# Patient Record
Sex: Male | Born: 1950 | Race: White | Hispanic: No | Marital: Married | Smoking: Former smoker
Health system: Southern US, Community
[De-identification: ages and names within clinical notes are randomized; demographics above are authoritative.]

## PROBLEM LIST (undated history)

## (undated) DIAGNOSIS — K5790 Diverticulosis of intestine, part unspecified, without perforation or abscess without bleeding: Secondary | ICD-10-CM

## (undated) DIAGNOSIS — R413 Other amnesia: Secondary | ICD-10-CM

## (undated) DIAGNOSIS — F17200 Nicotine dependence, unspecified, uncomplicated: Secondary | ICD-10-CM

## (undated) DIAGNOSIS — J439 Emphysema, unspecified: Secondary | ICD-10-CM

## (undated) DIAGNOSIS — I10 Essential (primary) hypertension: Secondary | ICD-10-CM

## (undated) DIAGNOSIS — I700 Atherosclerosis of aorta: Secondary | ICD-10-CM

## (undated) DIAGNOSIS — E785 Hyperlipidemia, unspecified: Secondary | ICD-10-CM

## (undated) DIAGNOSIS — H9313 Tinnitus, bilateral: Secondary | ICD-10-CM

## (undated) DIAGNOSIS — E7800 Pure hypercholesterolemia, unspecified: Secondary | ICD-10-CM

## (undated) DIAGNOSIS — I2510 Atherosclerotic heart disease of native coronary artery without angina pectoris: Secondary | ICD-10-CM

## (undated) HISTORY — DX: Tinnitus, bilateral: H93.13

## (undated) HISTORY — DX: Emphysema, unspecified: J43.9

## (undated) HISTORY — DX: Nicotine dependence, unspecified, uncomplicated: F17.200

## (undated) HISTORY — PX: HERNIA REPAIR: SHX51

## (undated) HISTORY — PX: HAND SURGERY: SHX662

## (undated) HISTORY — DX: Atherosclerosis of aorta: I70.0

## (undated) HISTORY — DX: Pure hypercholesterolemia, unspecified: E78.00

## (undated) HISTORY — PX: HEMORRHOID SURGERY: SHX153

## (undated) HISTORY — PX: CARPAL TUNNEL RELEASE: SHX101

## (undated) HISTORY — PX: LEG SURGERY: SHX1003

## (undated) HISTORY — DX: Other amnesia: R41.3

## (undated) HISTORY — DX: Diverticulosis of intestine, part unspecified, without perforation or abscess without bleeding: K57.90

## (undated) HISTORY — DX: Atherosclerotic heart disease of native coronary artery without angina pectoris: I25.10

---

## 2003-05-21 ENCOUNTER — Ambulatory Visit (HOSPITAL_COMMUNITY): Admission: RE | Admit: 2003-05-21 | Discharge: 2003-05-21 | Payer: Self-pay | Admitting: Family Medicine

## 2004-02-04 ENCOUNTER — Ambulatory Visit (HOSPITAL_COMMUNITY): Admission: RE | Admit: 2004-02-04 | Discharge: 2004-02-04 | Payer: Self-pay | Admitting: Surgery

## 2004-02-04 ENCOUNTER — Ambulatory Visit (HOSPITAL_BASED_OUTPATIENT_CLINIC_OR_DEPARTMENT_OTHER): Admission: RE | Admit: 2004-02-04 | Discharge: 2004-02-04 | Payer: Self-pay | Admitting: Surgery

## 2005-06-11 ENCOUNTER — Ambulatory Visit (HOSPITAL_COMMUNITY): Admission: RE | Admit: 2005-06-11 | Discharge: 2005-06-11 | Payer: Self-pay | Admitting: Gastroenterology

## 2011-01-22 ENCOUNTER — Ambulatory Visit (INDEPENDENT_AMBULATORY_CARE_PROVIDER_SITE_OTHER): Payer: Commercial Managed Care - PPO | Admitting: General Surgery

## 2011-01-22 VITALS — BP 110/68 | HR 70 | Temp 98.2°F | Resp 16 | Ht 71.0 in | Wt 221.0 lb

## 2011-01-22 DIAGNOSIS — K644 Residual hemorrhoidal skin tags: Secondary | ICD-10-CM

## 2011-01-22 NOTE — Progress Notes (Signed)
Subjective:   Painful hemorrhoids  Patient ID: Evan [name], male   DOB: 01-27-1951, 60 y.o.   MRN: [id number] HPI Patient is a 60 year old male referred by Dr. Kevan Ny due to severe painful hemorrhoids. He states that for a number of years he has had some mild hemorrhoid symptoms with occasional bleeding and irritation that did not require specific treatment. He was doing lifting this past weekend now 3 days ago and developed the fairly sudden onset of severe perianal swelling and pain. He was seen today by Dr. Kevan Ny and referred. He did have a colonoscopy 5 years ago. He is using pain medication and creams which are helping with symptoms but he feels the hemorrhoids are about the same.  Review of Systems  Respiratory: Negative.   Cardiovascular: Negative.   Gastrointestinal: Negative.        Objective:   Physical Exam General: Moderately obese Caucasian male in no severe distress Skin: Warm and dry HEENT: No scleral icterus or mass Lungs: Clear without increased work of breathing Cardiovascular: Regular rate and rhythm without murmur Abdomen: Well-healed supraumbilical incision. Soft and nontender without distention. Rectal: There are severe thrombosed circumferential external hemorrhoids with some internal hemorrhoid prolapse and early strangulation as well. Very tender. 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 Gait normal    Assessment:     Severe thrombosed external hemorrhoids with internal hemorrhoid prolapse and early strangulation as well. We discussed options of continued medical management versus formal hemorrhoidectomy. There is not really any sort of minor office procedure I can offer that would provide any relief for this problem. I believe he would be best served with a formal 2 or 3 column hemorrhoidectomy on an urgent basis. After discussion he is agreeable to this. We discussed the nature of the  procedure and used a general anesthetic and risks of bleeding and infection. We'll schedule this for him tomorrow.    Plan:     Urgent formal hemorrhoidectomy under general anesthesia. We'll plan overnight hospitalization.

## 2011-01-23 ENCOUNTER — Other Ambulatory Visit (INDEPENDENT_AMBULATORY_CARE_PROVIDER_SITE_OTHER): Payer: Self-pay | Admitting: General Surgery

## 2011-01-23 ENCOUNTER — Ambulatory Visit (HOSPITAL_COMMUNITY): Payer: 59

## 2011-01-23 ENCOUNTER — Ambulatory Visit (HOSPITAL_COMMUNITY)
Admission: RE | Admit: 2011-01-23 | Discharge: 2011-01-24 | Disposition: A | Discharge status: Home / Self Care | Payer: 59 | Source: Ambulatory Visit | Attending: General Surgery | Admitting: General Surgery

## 2011-01-23 DIAGNOSIS — F17200 Nicotine dependence, unspecified, uncomplicated: Secondary | ICD-10-CM | POA: Insufficient documentation

## 2011-01-23 DIAGNOSIS — I10 Essential (primary) hypertension: Secondary | ICD-10-CM | POA: Insufficient documentation

## 2011-01-23 DIAGNOSIS — K645 Perianal venous thrombosis: Secondary | ICD-10-CM | POA: Insufficient documentation

## 2011-01-23 DIAGNOSIS — K648 Other hemorrhoids: Secondary | ICD-10-CM

## 2011-01-23 DIAGNOSIS — K644 Residual hemorrhoidal skin tags: Secondary | ICD-10-CM

## 2011-01-23 HISTORY — PX: HEMORRHOID SURGERY: SHX153

## 2011-01-23 LAB — BASIC METABOLIC PANEL
BUN: 13 mg/dL (ref 6–23)
CO2: 29 mEq/L (ref 19–32)
Calcium: 9.4 mg/dL (ref 8.4–10.5)
Chloride: 103 mEq/L (ref 96–112)
Creatinine, Ser: 0.76 mg/dL (ref 0.50–1.35)
GFR calc Af Amer: >90 mL/min (ref >90)
GFR calc non Af Amer: >90 mL/min (ref >90)
Glucose, Bld: 91 mg/dL (ref 70–99)
Potassium: 4.2 mEq/L (ref 3.5–5.1)
Sodium: 139 mEq/L (ref 135–145)

## 2011-01-23 LAB — CBC
HCT: 45.4 % (ref 39.0–52.0)
Hemoglobin: 15.6 g/dL (ref 13.0–17.0)
MCH: 30.6 pg (ref 26.0–34.0)
MCHC: 34.4 g/dL (ref 30.0–36.0)
MCV: 89 fL (ref 78.0–100.0)
Platelets: 245 10*3/uL (ref 150–400)
RBC: 5.1 MIL/uL (ref 4.22–5.81)
RDW: 12.9 % (ref 11.5–15.5)
WBC: 8.6 10*3/uL (ref 4.0–10.5)

## 2011-01-23 LAB — SURGICAL PCR SCREEN
MRSA, PCR: NEGATIVE
Staphylococcus aureus: NEGATIVE

## 2011-01-26 ENCOUNTER — Telehealth (INDEPENDENT_AMBULATORY_CARE_PROVIDER_SITE_OTHER): Payer: Self-pay | Admitting: General Surgery

## 2011-01-26 NOTE — Telephone Encounter (Signed)
The patient contacted the office requesting a refill on Lidocaine gel 5 %, ok to refill per Dr. Ezzard Standing, notified patient. Sent to [facility] on [address]

## 2011-01-29 NOTE — Op Note (Signed)
  NAMETYWAUN, HILTNER                  ACCOUNT NO.:  [id number]  MEDICAL RECORD NO.:  [id number]  LOCATION:  1532                         FACILITY:  [facility]  PHYSICIAN:  Sharlet Salina T. Kailand Seda, M.D.DATE OF BIRTH:  [birth year]  DATE OF PROCEDURE:  01/23/2011 DATE OF DISCHARGE:                              OPERATIVE REPORT   PREOPERATIVE DIAGNOSIS:  Severe thrombosed prolapsed combination hemorrhoids.  POSTOPERATIVE DIAGNOSIS:  Severe thrombosed prolapsed combination hemorrhoids.  SURGICAL PROCEDURE:  Extensive 3-column hemorrhoidectomy.  SURGEON:  Lorne Skeens. Dequante Tremaine, M.D.  ANESTHESIA:  General.  BRIEF HISTORY:  Mr. Mcgregor is a 60 year old male with history of mild hemorrhoid symptoms for number of years.  While lifting 2 days ago, he developed sudden onset of rectal pain and swelling and presents with circumferential severe thrombosed, prolapsed combination hemorrhoids. We discussed options for continued conservative versus surgical management and have elected to proceed with hemorrhoidectomy.  We discussed the nature of the procedure, its indications, risks of anesthetic complications, bleeding, infection, and rare risk of a stenosis.  He is now brought to operating room for this.  DESCRIPTION OF PROCEDURE:  The patient was to the operating room, placed in supine position on operative table. General endotracheal anesthesia was induced. He was carefully positioned in lithotomy position. The perineum widely sterilely prepped and draped.  He received preoperative IV antibiotics.  PAS were in place.  Correct patient and procedure were verified.  Examination of the anus again confirmed large groups of thrombosed, mostly external but also an internal prolapsed hemorrhoids. These did fall into a concentrated groups in the right anterior, right posterior, and left lateral areas.  A Sims retractor was placed and initially the right anterior group was exposed.  A 2-0 chromic suture was  placed around the base of the hemorrhoid group internally.  An elliptical incision extending out on anoderm and encompassing the large group of hemorrhoids was performed, initially with superficial sharp incision and then dissection using the Harmonic scalpel.  The hemorrhoid group was dissected up off the anal sphincter which was identified and carefully protected.  After excision, the incision was closed with a running chromic in a locking fashion out on to the external skin. Following this, identical excisions were performed in the right posterior and left lateral position.  There was plenty of anoderm and no stenosis apparent. At the end of the procedure, the soft tissue was infiltrated with 20 cc of Exparel long-acting local anesthetic.  There was no bleeding.  Dry gauze dressing was applied. The patient was taken to the recovery in good condition.     Lorne Skeens. Orlen Leedy, M.D.     Tory Emerald  D:  01/23/2011  T:  01/24/2011  Job:  [id number]  Electronically Signed by Glenna Fellows M.D. on 01/29/2011 02:45:53 PM

## 2011-02-08 ENCOUNTER — Encounter (INDEPENDENT_AMBULATORY_CARE_PROVIDER_SITE_OTHER): Payer: Self-pay | Admitting: General Surgery

## 2011-02-08 ENCOUNTER — Ambulatory Visit (INDEPENDENT_AMBULATORY_CARE_PROVIDER_SITE_OTHER): Payer: Commercial Managed Care - PPO | Admitting: General Surgery

## 2011-02-08 VITALS — BP 118/82 | HR 80 | Temp 97.0°F | Resp 20 | Ht 71.0 in | Wt 217.1 lb

## 2011-02-08 DIAGNOSIS — Z09 Encounter for follow-up examination after completed treatment for conditions other than malignant neoplasm: Secondary | ICD-10-CM

## 2011-02-08 NOTE — Progress Notes (Signed)
Patient returns following extensive open hemorrhoidectomy for severe acute thrombosed hemorrhoids.He reports he is steadily feeling better.He still has some discomfort in the morning and feels essentially well in the afternoon.  On examination the incisions are healing nicely and there is no evidence of infection and very minimal swelling  Assessment and plan: Doing well following hemorrhoidectomy. Return in one month for a final check.

## 2011-03-12 ENCOUNTER — Telehealth (INDEPENDENT_AMBULATORY_CARE_PROVIDER_SITE_OTHER): Payer: Self-pay

## 2011-03-12 NOTE — Telephone Encounter (Signed)
Called and left patient voice message to call our office, pt. appt has been rescheduled to 03/29/11.

## 2011-03-16 ENCOUNTER — Encounter (INDEPENDENT_AMBULATORY_CARE_PROVIDER_SITE_OTHER): Payer: Commercial Managed Care - PPO | Admitting: General Surgery

## 2011-03-29 ENCOUNTER — Ambulatory Visit (INDEPENDENT_AMBULATORY_CARE_PROVIDER_SITE_OTHER): Payer: Commercial Managed Care - PPO | Admitting: General Surgery

## 2011-03-29 ENCOUNTER — Encounter (INDEPENDENT_AMBULATORY_CARE_PROVIDER_SITE_OTHER): Payer: Self-pay | Admitting: General Surgery

## 2011-03-29 VITALS — BP 128/82 | HR 74 | Temp 97.8°F | Resp 16 | Ht 71.0 in | Wt 221.4 lb

## 2011-03-29 DIAGNOSIS — Z09 Encounter for follow-up examination after completed treatment for conditions other than malignant neoplasm: Secondary | ICD-10-CM

## 2011-03-29 NOTE — Progress Notes (Signed)
Patient returns for more long-term followup approximately 6 weeks following emergency extensive hemorrhoidectomy for severe thrombosed gangrenous external hemorrhoids. He reports he is doing well. He has no pain or bleeding. Bowel movements are fine. He still feels a little bit tender which is improving.  On examination external exam shows no evidence of residual hemorrhoids. Digital exam shows no tenderness stenosis or other problems.  Assessment plan: Doing well with no palpitation identified. He is discharged return as needed.

## 2014-05-03 ENCOUNTER — Ambulatory Visit
Admission: RE | Admit: 2014-05-03 | Discharge: 2014-05-03 | Disposition: A | Discharge status: Home / Self Care | Payer: 59 | Source: Ambulatory Visit | Attending: Family Medicine | Admitting: Family Medicine

## 2014-05-03 ENCOUNTER — Other Ambulatory Visit: Payer: Self-pay | Admitting: Family Medicine

## 2014-05-03 DIAGNOSIS — R05 Cough: Secondary | ICD-10-CM

## 2014-05-03 DIAGNOSIS — R059 Cough, unspecified: Secondary | ICD-10-CM

## 2015-04-14 MED FILL — IBUPROFEN 600 MG TABLET: 600 | 5 days supply | Qty: 16 | Fill #0

## 2015-04-14 MED FILL — AMOXICILLIN 500 MG CAPSULE: 500 | 6 days supply | Qty: 25 | Fill #0

## 2015-04-14 MED FILL — HYDROCODON-APAP 5-325: 5-325 | 3 days supply | Qty: 16 | Fill #0

## 2015-06-30 MED FILL — LOSARTAN POTASSIUM 50 MG TA: 50 | 90 days supply | Qty: 90 | Fill #1

## 2015-06-30 MED FILL — SIMVASTATIN 40 MG TABLET: 40 | 90 days supply | Qty: 90 | Fill #1

## 2015-09-27 MED FILL — SIMVASTATIN 40 MG TABLET: 40 | 90 days supply | Qty: 90 | Fill #2

## 2015-09-27 MED FILL — LOSARTAN POTASSIUM 50 MG TA: 50 | 90 days supply | Qty: 90 | Fill #2

## 2015-12-29 MED FILL — LOSARTAN POTASSIUM 50 MG TA: 50 | 90 days supply | Qty: 90 | Fill #3

## 2015-12-29 MED FILL — SIMVASTATIN 40 MG TABLET: 40 | 90 days supply | Qty: 90 | Fill #3

## 2016-01-06 DIAGNOSIS — Z23 Encounter for immunization: Secondary | ICD-10-CM | POA: Diagnosis not present

## 2016-03-24 DIAGNOSIS — H52223 Regular astigmatism, bilateral: Secondary | ICD-10-CM | POA: Diagnosis not present

## 2016-03-24 DIAGNOSIS — H5203 Hypermetropia, bilateral: Secondary | ICD-10-CM | POA: Diagnosis not present

## 2016-03-24 DIAGNOSIS — H524 Presbyopia: Secondary | ICD-10-CM | POA: Diagnosis not present

## 2016-03-29 MED FILL — SIMVASTATIN 40 MG TABLET: 40 | 90 days supply | Qty: 90 | Fill #0

## 2016-03-29 MED FILL — LOSARTAN POTASSIUM 50 MG TA: 50 | 90 days supply | Qty: 90 | Fill #0

## 2016-04-06 DIAGNOSIS — Z125 Encounter for screening for malignant neoplasm of prostate: Secondary | ICD-10-CM | POA: Diagnosis not present

## 2016-04-06 DIAGNOSIS — I10 Essential (primary) hypertension: Secondary | ICD-10-CM | POA: Diagnosis not present

## 2016-04-06 DIAGNOSIS — R7303 Prediabetes: Secondary | ICD-10-CM | POA: Diagnosis not present

## 2016-04-06 DIAGNOSIS — E7800 Pure hypercholesterolemia, unspecified: Secondary | ICD-10-CM | POA: Diagnosis not present

## 2016-04-06 DIAGNOSIS — Z0000 Encounter for general adult medical examination without abnormal findings: Secondary | ICD-10-CM | POA: Diagnosis not present

## 2016-05-12 ENCOUNTER — Emergency Department (HOSPITAL_COMMUNITY): Payer: 59

## 2016-05-12 ENCOUNTER — Encounter (HOSPITAL_COMMUNITY): Payer: Self-pay | Admitting: Emergency Medicine

## 2016-05-12 ENCOUNTER — Emergency Department (HOSPITAL_COMMUNITY)
Admission: EM | Admit: 2016-05-12 | Discharge: 2016-05-12 | Disposition: A | Discharge status: Home / Self Care | Payer: 59 | Attending: Emergency Medicine | Admitting: Emergency Medicine

## 2016-05-12 DIAGNOSIS — Z79899 Other long term (current) drug therapy: Secondary | ICD-10-CM | POA: Insufficient documentation

## 2016-05-12 DIAGNOSIS — R079 Chest pain, unspecified: Secondary | ICD-10-CM

## 2016-05-12 DIAGNOSIS — Z87891 Personal history of nicotine dependence: Secondary | ICD-10-CM | POA: Insufficient documentation

## 2016-05-12 DIAGNOSIS — I10 Essential (primary) hypertension: Secondary | ICD-10-CM | POA: Diagnosis not present

## 2016-05-12 DIAGNOSIS — Z7982 Long term (current) use of aspirin: Secondary | ICD-10-CM | POA: Diagnosis not present

## 2016-05-12 DIAGNOSIS — R072 Precordial pain: Secondary | ICD-10-CM | POA: Insufficient documentation

## 2016-05-12 HISTORY — DX: Essential (primary) hypertension: I10

## 2016-05-12 HISTORY — DX: Hyperlipidemia, unspecified: E78.5

## 2016-05-12 LAB — BASIC METABOLIC PANEL
Anion gap: 8 (ref 5–15)
BUN: 15 mg/dL (ref 6–20)
CO2: 27 mmol/L (ref 22–32)
Calcium: 9.3 mg/dL (ref 8.9–10.3)
Chloride: 104 mmol/L (ref 101–111)
Creatinine, Ser: 0.93 mg/dL (ref 0.61–1.24)
GFR calc Af Amer: >60 mL/min (ref >60)
GFR calc non Af Amer: >60 mL/min (ref >60)
Glucose, Bld: 122 mg/dL — ABNORMAL HIGH (ref 65–99)
Potassium: 3.9 mmol/L (ref 3.5–5.1)
Sodium: 139 mmol/L (ref 135–145)

## 2016-05-12 LAB — I-STAT TROPONIN, ED
Troponin i, poc: 0 ng/mL (ref 0.00–0.08)
Troponin i, poc: 0 ng/mL (ref 0.00–0.08)

## 2016-05-12 LAB — CBC
HCT: 46 % (ref 39.0–52.0)
Hemoglobin: 15.8 g/dL (ref 13.0–17.0)
MCH: 29.2 pg (ref 26.0–34.0)
MCHC: 34.3 g/dL (ref 30.0–36.0)
MCV: 85 fL (ref 78.0–100.0)
Platelets: 291 10*3/uL (ref 150–400)
RBC: 5.41 MIL/uL (ref 4.22–5.81)
RDW: 13.1 % (ref 11.5–15.5)
WBC: 6.5 10*3/uL (ref 4.0–10.5)

## 2016-05-12 NOTE — ED Triage Notes (Signed)
Pt complaint of central chest pressure and reflux awoke from sleep at 0600 this morning; denies hx of same. Denies associated symptoms.

## 2016-05-12 NOTE — Discharge Instructions (Signed)
The work up in the ED was normal.  Please follow up with cardiologist next week for further evaluation and outpatient work up.  Return to the ER if you develop chest pain with nausea, sweating, shortness of breath or radiation to right arm or back.

## 2016-05-12 NOTE — ED Provider Notes (Signed)
[facility] DEPT Provider Note   CSN: [id number] Arrival date & time: 05/12/16  [id number]     History   Chief Complaint Chief Complaint  Patient presents with  . Chest Pain    HPI Evan [name] is a 66 y.o. male with pertinent past medical history of hypertension, hyperlipidemia obesity, remote history of tobacco abuse (quit over a year ago) presents to the emergency department reporting 2 episodes of reflux like, sternal intermittent chest discomfort that occurred in the middle of the night last night. Patient currently denies symptoms. Patient states he woke up to use the restroom in the middle of the night and noticed reflux like substernal/upper stomach discomfort, patient took 2 Tums and return to sleep. The second episode of chest discomfort occurred when he finally got out of bed, patient was still experiencing reflux-like chest discomfort so he drank a cup of milk which also did not help. Patient does have a history of reflux. Patient became concerned as he was unable to control his discomfort with Tums. Patient denies diaphoresis, nausea, vomiting, shortness of breath or radiation of chest discomfort to jaw or upper extremities. Patient states that he had one episode of chest pain 10 years ago where he was admitted for one day and a hospital in [place]. Patient states that they did some tests and told him that he was okay to go home. Patient has never been followed by a cardiologist.  HPI  Past Medical History:  Diagnosis Date  . Hyperlipemia   . Hypertension     There are no active problems to display for this patient.   Past Surgical History:  Procedure Laterality Date  . HAND SURGERY     multiple reconstructive surgeries   . HEMORRHOID SURGERY  01/23/11  . HEMORRHOID SURGERY    . HERNIA REPAIR    . LEG SURGERY         Home Medications    Prior to Admission medications   Medication Sig Start Date End Date Taking? Authorizing Provider  acetaminophen (TYLENOL) 500  MG tablet Take 1,000 mg by mouth every 6 (six) hours as needed.   Yes Historical Provider, MD  aspirin EC 81 MG tablet Take 81 mg by mouth daily.   Yes Historical Provider, MD  losartan (COZAAR) 50 MG tablet Take 50 mg by mouth at bedtime. 03/29/16  Yes Historical Provider, MD  simvastatin (ZOCOR) 40 MG tablet Take 40 mg by mouth daily.     Yes Historical Provider, MD    Family History No family history on file.  Social History Social History  Substance Use Topics  . Smoking status: Former Smoker    Packs/day: 0.00    Quit date: 08/2015  . Smokeless tobacco: Never Used  . Alcohol use Yes     Allergies   Patient has no active allergies.   Review of Systems Review of Systems  Constitutional: Negative for chills and fever.  HENT: Negative for congestion and sore throat.   Eyes: Negative for visual disturbance.  Respiratory: Negative for cough and shortness of breath.   Cardiovascular: Positive for chest pain. Negative for palpitations.  Gastrointestinal: Negative for abdominal pain, blood in stool, constipation, diarrhea, nausea and vomiting.  Genitourinary: Negative for difficulty urinating, flank pain and hematuria.  Musculoskeletal: Negative for joint swelling and myalgias.  Skin: Negative for rash.  Neurological: Negative for dizziness, syncope, weakness, light-headedness and headaches.  Hematological: Negative.   Psychiatric/Behavioral: Negative.      Physical Exam Updated Vital Signs BP  133/86 (BP Location: Right Arm)   Pulse 66   Temp 97.9 F (36.6 C) (Oral)   Resp 16   Ht 5\' 11"  (1.803 m)   Wt 97.5 kg   SpO2 97%   BMI 29.99 kg/m   Physical Exam  Constitutional: He is oriented to person, place, and time. He appears well-developed and well-nourished. No distress.  NAD.  HENT:  Head: Normocephalic and atraumatic.  Nose: Nose normal.  Mouth/Throat: Oropharynx is clear and moist. No oropharyngeal exudate.  Moist mucous membranes.  Oropharynx and tonsils  pink without erythema, edema, exudates or lesions.  Uvula midline. No trismus.   Eyes: Conjunctivae and EOM are normal. Pupils are equal, round, and reactive to light.  Neck: Normal range of motion. Neck supple. No JVD present. No tracheal deviation present.  Cardiovascular:  SBP 130. HR 73.  No JVD with head of bed at 30 degrees. Carotid pulses 2+ bilaterally without bruits. No lateral displacement of apical pulse.  Apical pulse without thrills or lifts.  Good S1 and S2. No extra sounds or murmurs. 2+ and symmetric radial, dorsalis pedis and posterior tibial pulses bilaterally.   Capillary refill brisk in upper extremities.  No varicosities seen. No lower extremity edema.  Patient denied shortness of breath, throat congestion or difficulty breathing with head of the bed flat. No orthopnea.  Good lung sounds without crackles.  Pulmonary/Chest: Effort normal and breath sounds normal. No respiratory distress. He has no wheezes. He has no rales.  Abdominal: Soft. Bowel sounds are normal. He exhibits no distension. There is no tenderness.  Musculoskeletal: Normal range of motion. He exhibits no deformity.  Lymphadenopathy:    He has no cervical adenopathy.  Neurological: He is alert and oriented to person, place, and time.  Skin: Skin is warm and dry. Capillary refill takes less than 2 seconds.  Psychiatric: He has a normal mood and affect. His behavior is normal. Judgment and thought content normal.  Nursing note and vitals reviewed.    ED Treatments / Results  Labs (all labs ordered are listed, but only abnormal results are displayed) Labs Reviewed  BASIC METABOLIC PANEL - Abnormal; Notable for the following:       Result Value   Glucose, Bld 122 (*)    All other components within normal limits  CBC  I-STAT TROPOININ, ED  I-STAT TROPOININ, ED    EKG  EKG Interpretation None       Radiology Dg Chest 2 View  Result Date: 05/12/2016 CLINICAL DATA:  Chest pain since this  morning. EXAM: CHEST  2 VIEW COMPARISON:  05/03/2014 FINDINGS: The cardiac silhouette, mediastinal and hilar contours are within normal limits and stable. The lungs are clear. No pleural effusion. The bony thorax is intact. IMPRESSION: No acute cardiopulmonary findings. Electronically Signed   By: Marijo Sanes M.D.   On: 05/12/2016 11:09    Procedures Procedures (including critical care time)  Medications Ordered in ED Medications - No data to display   Initial Impression / Assessment and Plan / ED Course  I have reviewed the triage vital signs and the nursing notes.  Pertinent labs & imaging results that were available during my care of the patient were reviewed by me and considered in my medical decision making (see chart for details).  Clinical Course as of May 13 812  Sat May 12, 2016  1137 EKG Rate 75 Sinus rhythm RBBB and LPFB No st t changes, no prior ECG for comparison  [JK]  Sun May 13, 2016  0810 Normal SBP BP: 130/81 [CG]  0810 Normal HR Pulse Rate: 73 [CG]  0810 CXR without cardiopulmonary disease DG Chest 2 View [CG]  0811 Troponin x 2 normal Troponin i, poc: 0.00 [CG]    Clinical Course User Index [CG] Kinnie Feil, PA-C [JK] Dorie Rank, MD   Pt is a 66 y.o. male presents with CP. Pertinent risk factors include HTN, hypercholesterolemia, obesity.  No smoking (quit > 1 year ago), no known positive family hx, no known CAD (preious MI, PCI/CABG, CVA/TIA or PAD).  On exam VS are within normal limits. RRR, symmetric pulses bilaterally.  Pt is HD stable.  CXR, EKG, troponin x 2 within normal limits.  CBC and BMP unremarkable.  Heart score = 3.  Pt denied chest pain, shortness of breath in ED and did no require any medcications in ED. Patient is to be discharged with recommendation to follow up with PCP and cardiologist in regards to today's hospital visit. It is possible that chest pain could be due to cardiac etiology however given presentation, PERC negative, VS within  normal limits and stable, no tracheal deviation, no JVD or new murmur, RRR, breath sounds equal bilaterally, and low risk HEART score, patient is low risk and is safe for discharge for outpatient cardiac work up.  Pt has been advised to return to the ED is CP becomes exertional, associated with diaphoresis or nausea, radiates to left jaw/arm, worsens or becomes concerning in any way. Pt appears reliable for follow up and is agreeable to discharge. Patient is in no acute distress. Vital Signs are within normal limits. Patient is able to ambulate. Patient able to tolerate PO.    Final Clinical Impressions(s) / ED Diagnoses   Final diagnoses:  Chest pain, unspecified type    New Prescriptions Discharge Medication List as of 05/12/2016  3:01 PM       Kinnie Feil, PA-C 05/13/16 [id number]    Dorie Rank, MD 05/14/16 [phone]

## 2016-05-18 DIAGNOSIS — R079 Chest pain, unspecified: Secondary | ICD-10-CM | POA: Diagnosis not present

## 2016-05-21 ENCOUNTER — Telehealth: Payer: Self-pay

## 2016-05-21 NOTE — Telephone Encounter (Signed)
SENT NOTES TO SCHEDULING 

## 2016-06-01 MED FILL — GAVILYTE-N SOLUTION: 420 | 1 days supply | Qty: 4000 | Fill #0

## 2016-06-04 DIAGNOSIS — Z1211 Encounter for screening for malignant neoplasm of colon: Secondary | ICD-10-CM | POA: Diagnosis not present

## 2016-06-04 DIAGNOSIS — K5730 Diverticulosis of large intestine without perforation or abscess without bleeding: Secondary | ICD-10-CM | POA: Diagnosis not present

## 2016-06-08 ENCOUNTER — Encounter: Payer: Self-pay | Admitting: Cardiology

## 2016-06-08 ENCOUNTER — Ambulatory Visit (INDEPENDENT_AMBULATORY_CARE_PROVIDER_SITE_OTHER): Payer: 59 | Admitting: Cardiology

## 2016-06-08 ENCOUNTER — Encounter (INDEPENDENT_AMBULATORY_CARE_PROVIDER_SITE_OTHER): Payer: Self-pay

## 2016-06-08 ENCOUNTER — Telehealth (HOSPITAL_COMMUNITY): Payer: Self-pay | Admitting: *Deleted

## 2016-06-08 VITALS — BP 140/90 | HR 81 | Ht 71.0 in | Wt 216.2 lb

## 2016-06-08 DIAGNOSIS — Z87891 Personal history of nicotine dependence: Secondary | ICD-10-CM | POA: Insufficient documentation

## 2016-06-08 DIAGNOSIS — Z8249 Family history of ischemic heart disease and other diseases of the circulatory system: Secondary | ICD-10-CM | POA: Diagnosis not present

## 2016-06-08 DIAGNOSIS — R0789 Other chest pain: Secondary | ICD-10-CM | POA: Diagnosis not present

## 2016-06-08 DIAGNOSIS — E7800 Pure hypercholesterolemia, unspecified: Secondary | ICD-10-CM | POA: Diagnosis not present

## 2016-06-08 DIAGNOSIS — I4510 Unspecified right bundle-branch block: Secondary | ICD-10-CM | POA: Insufficient documentation

## 2016-06-08 NOTE — Patient Instructions (Signed)
Medication Instructions:  Your physician recommends that you continue on your current medications as directed. Please refer to the Current Medication list given to you today.   Labwork: -None  Testing/Procedures: Your physician has requested that you have en exercise stress myoview. For further information please visit HugeFiesta[facility][email or web address]. Please follow instruction sheet, as given.    Follow-Up: Your physician recommends that you  follow-up as needed.   Any Other Special Instructions Will Be Listed Below (If Applicable).     If you need a refill on your cardiac medications before your next appointment, please call your pharmacy.

## 2016-06-08 NOTE — Progress Notes (Signed)
Cardiology Office Note    Date:  06/08/2016   ID:  Evan [name], DOB 1950-10-22, MRN [id number]  PCP:  Lujean Amel, MD  Cardiologist:   Candee Furbish, MD     History of Present Illness:  Evan [name] is a 66 y.o. male with GERD, HTN, HL, former smoker here for the evaluation of chest pain at the request of Dr. Dorthy Cooler. He was seen in the ER on 05/12/16 with 2 episodes of GERD like intermittent chest pain occurring in the middle of the night. Tightness in mid chest. One of his meds got stuck in throat and probably caused pain. Not as concerned now. Did not control discomfort, moderate in severity, with TUMS. No radiation of pain. No SOB. 10 years ago had CP observation stay in [place]. His father had myocardial infarction at age 29.  Golf - 15000 steps no issues.   Trop negative, ECG with RBBB, no significant change from prior in 2012. No ST changes. CXR ok.   He works as a Dance movement psychotherapist, [employer].  Quit tob last May 2017  Past Medical History:  Diagnosis Date  . Hyperlipemia   . Hypertension     Past Surgical History:  Procedure Laterality Date  . HAND SURGERY     multiple reconstructive surgeries   . HEMORRHOID SURGERY  01/23/11  . HEMORRHOID SURGERY    . HERNIA REPAIR    . LEG SURGERY      Current Medications: Outpatient Medications Prior to Visit  Medication Sig Dispense Refill  . acetaminophen (TYLENOL) 500 MG tablet Take 1,000 mg by mouth every 6 (six) hours as needed.    Marland Kitchen aspirin EC 81 MG tablet Take 81 mg by mouth daily.    Marland Kitchen losartan (COZAAR) 50 MG tablet Take 50 mg by mouth at bedtime.  0  . simvastatin (ZOCOR) 40 MG tablet Take 40 mg by mouth daily.       No facility-administered medications prior to visit.      Allergies:   Patient has no active allergies.   Social History   Social History  . Marital status: Married    Spouse name: N/A  . Number of children: N/A  . Years of education: N/A   Social History Main Topics  . Smoking status:  Former Smoker    Packs/day: 0.00    Quit date: 08/2015  . Smokeless tobacco: Never Used  . Alcohol use Yes  . Drug use: No  . Sexual activity: Not on file   Other Topics Concern  . Not on file   Social History Narrative  . No narrative on file     Family History:  One brother had bypass surgery at age 22, his father had MI at age 56 but died of Alzheimer's.  ROS:   Please see the history of present illness.    ROS All other systems reviewed and are negative.   PHYSICAL EXAM:   VS:  BP 140/90   Pulse 81   Ht 5\' 11"  (1.803 m)   Wt 216 lb 3.2 oz (98.1 kg)   SpO2 93% Comment: at rest  BMI 30.15 kg/m    GEN: Well nourished, well developed, in no acute distress  HEENT: normal  Neck: no JVD, carotid bruits, or masses Cardiac: RRR; no murmurs, rubs, or gallops,no edema  Respiratory:  clear to auscultation bilaterally, normal work of breathing GI: soft, nontender, nondistended, + BS MS: Left hand deformity, lost his hand when he was 66 years old,  reconstructed at [facility] at age 89, skin grafts bilateral upper thighs-sometimes neuropathic pain. Skin: warm and dry, no rash Neuro:  Alert and Oriented x 3, Strength and sensation are intact Psych: euthymic mood, full affect  Wt Readings from Last 3 Encounters:  06/08/16 216 lb 3.2 oz (98.1 kg)  05/12/16 215 lb (97.5 kg)  03/29/11 221 lb 6.4 oz (100.4 kg)      Studies/Labs Reviewed:   EKG:  05/13/16- SR with RBBB, vertical axis. Prior 01/24/11 - IRBBB. Personally viewed. No ST changes.   Recent Labs: 05/12/2016: BUN 15; Creatinine, Ser 0.93; Hemoglobin 15.8; Platelets 291; Potassium 3.9; Sodium 139   Lipid Panel No results found for: CHOL, TRIG, HDL, CHOLHDL, VLDL, LDLCALC, LDLDIRECT  Additional studies/ records that were reviewed today include:  ER notes, CXR, ECG, labs reviewed    ASSESSMENT:    1. Atypical chest pain   2. Former smoker   3. Pure hypercholesterolemia   4. Family history of early CAD   7. RBBB       PLAN:  In order of problems listed above:  Atypical chest pain  - Given his strong family history, former smoking history, hypertension, hyperlipidemia, age we will proceed with exercise nuclear stress test.  - Continue to modify risk factors. Blood pressure control. He is currently on a statin.  Right bundle branch block  - Should not portend worsened cardiac diagnosis.  - If further deterioration of conduction system occurs, pacemaker may be warranted in the future.  - Discussed at length the physiology behind this.  Essential hypertension  - Continue with losartan. Blood pressure elevated today here in clinic however when he gives platelets his blood pressures have been in the [id number] systolic range.  Hyperlipidemia  - Currently on simvastatin 40 mg once a day. Excellent.  Former smoker  - He quit May 2017 after a friend of his had a severe COPD exacerbation. 5 of his friends quit at the same time.  Neuropathic pain  - This was noted at sites of skin grafts bilaterally upper leg. He has normal distal pulses.  Left hand reconstruction  - Stable.  Elevated glucose  - 122 random check in the emergency room on 05/12/16. Weight loss. Monitored by Dr. Dorthy Cooler.    Medication Adjustments/Labs and Tests Ordered: Current medicines are reviewed at length with the patient today.  Concerns regarding medicines are outlined above.  Medication changes, Labs and Tests ordered today are listed in the Patient Instructions below. Patient Instructions  Medication Instructions:  Your physician recommends that you continue on your current medications as directed. Please refer to the Current Medication list given to you today.   Labwork: -None  Testing/Procedures: Your physician has requested that you have en exercise stress myoview. For further information please visit HugeFiesta[employer][email or web address]. Please follow instruction sheet, as given.    Follow-Up: Your physician recommends that you   follow-up as needed.   Any Other Special Instructions Will Be Listed Below (If Applicable).     If you need a refill on your cardiac medications before your next appointment, please call your pharmacy.      Signed, Candee Furbish, MD  06/08/2016 9:08 AM    [facility] [address], [place], [place]  [address] Phone: [phone]; Fax: [phone]

## 2016-06-08 NOTE — Telephone Encounter (Signed)
Left message on voicemail in reference to upcoming appointment scheduled for 06/11/16. Phone number given for a call back so details instructions can be given. Evan [name]

## 2016-06-11 ENCOUNTER — Ambulatory Visit (HOSPITAL_COMMUNITY): Payer: 59 | Attending: Cardiovascular Disease

## 2016-06-11 DIAGNOSIS — I4510 Unspecified right bundle-branch block: Secondary | ICD-10-CM | POA: Diagnosis not present

## 2016-06-11 DIAGNOSIS — R42 Dizziness and giddiness: Secondary | ICD-10-CM | POA: Insufficient documentation

## 2016-06-11 DIAGNOSIS — R0789 Other chest pain: Secondary | ICD-10-CM | POA: Diagnosis not present

## 2016-06-11 DIAGNOSIS — I2510 Atherosclerotic heart disease of native coronary artery without angina pectoris: Secondary | ICD-10-CM | POA: Diagnosis not present

## 2016-06-11 DIAGNOSIS — I10 Essential (primary) hypertension: Secondary | ICD-10-CM | POA: Diagnosis not present

## 2016-06-11 LAB — MYOCARDIAL PERFUSION IMAGING
Estimated workload: 7 METS
Exercise duration (min): 6 min
LV dias vol: 70 mL (ref 62–150)
LV sys vol: 17 mL
MPHR: 156 {beats}/min
Peak HR: 144 {beats}/min
Percent HR: 92 %
RATE: 0.34
RPE: 18
Rest HR: 68 {beats}/min
SDS: 2
SRS: 4
SSS: 6
TID: 0.94

## 2016-06-11 MED ORDER — TECHNETIUM TC 99M TETROFOSMIN IV KIT
9.8000 | PACK | Freq: Once | INTRAVENOUS | Status: AC | PRN
  Administered 2016-06-11: 9.8 via INTRAVENOUS
  Filled 2016-06-11: qty 10

## 2016-06-11 MED ORDER — TECHNETIUM TC 99M TETROFOSMIN IV KIT
32.8000 | PACK | Freq: Once | INTRAVENOUS | Status: AC | PRN
  Administered 2016-06-11: 32.8 via INTRAVENOUS
  Filled 2016-06-11: qty 33

## 2016-06-29 MED FILL — LOSARTAN POTASSIUM 50 MG TA: 50 | 90 days supply | Qty: 90 | Fill #0

## 2016-06-29 MED FILL — SIMVASTATIN 40 MG TABLET: 40 | 90 days supply | Qty: 90 | Fill #0

## 2016-07-02 MED FILL — CHLORHEXIDINE 0.12% RINSE: 0.12 | 17 days supply | Qty: 473 | Fill #0

## 2016-07-11 DIAGNOSIS — J209 Acute bronchitis, unspecified: Secondary | ICD-10-CM | POA: Diagnosis not present

## 2016-07-11 MED FILL — predniSONE 10 MG TABS: 10 | 6 days supply | Qty: 21 | Fill #0

## 2016-07-11 MED FILL — AZITHROMYCIN 250 MG TABLET: 250 | 5 days supply | Qty: 6 | Fill #0

## 2016-10-08 MED FILL — SIMVASTATIN 40 MG TABLET: 40 | 90 days supply | Qty: 90 | Fill #1

## 2016-10-08 MED FILL — LOSARTAN POTASSIUM 50 MG TA: 50 | 90 days supply | Qty: 90 | Fill #1

## 2017-01-09 MED FILL — LOSARTAN POTASSIUM 50 MG TA: 50 | 90 days supply | Qty: 90 | Fill #2

## 2017-01-09 MED FILL — SIMVASTATIN 40 MG TABLET: 40 | 90 days supply | Qty: 90 | Fill #2

## 2017-03-29 MED FILL — SIMVASTATIN 40 MG TABLET: 40 | 90 days supply | Qty: 90 | Fill #3

## 2017-03-29 MED FILL — LOSARTAN POTASSIUM 50 MG TA: 50 | 90 days supply | Qty: 90 | Fill #3

## 2017-04-10 ENCOUNTER — Other Ambulatory Visit: Payer: Self-pay | Admitting: Family Medicine

## 2017-04-10 DIAGNOSIS — Z23 Encounter for immunization: Secondary | ICD-10-CM | POA: Diagnosis not present

## 2017-04-10 DIAGNOSIS — Z136 Encounter for screening for cardiovascular disorders: Secondary | ICD-10-CM

## 2017-04-10 DIAGNOSIS — E7800 Pure hypercholesterolemia, unspecified: Secondary | ICD-10-CM | POA: Diagnosis not present

## 2017-04-10 DIAGNOSIS — Z0000 Encounter for general adult medical examination without abnormal findings: Secondary | ICD-10-CM | POA: Diagnosis not present

## 2017-04-10 DIAGNOSIS — Z87891 Personal history of nicotine dependence: Secondary | ICD-10-CM | POA: Diagnosis not present

## 2017-04-10 DIAGNOSIS — Z125 Encounter for screening for malignant neoplasm of prostate: Secondary | ICD-10-CM | POA: Diagnosis not present

## 2017-04-10 DIAGNOSIS — I10 Essential (primary) hypertension: Secondary | ICD-10-CM | POA: Diagnosis not present

## 2017-04-15 ENCOUNTER — Other Ambulatory Visit: Payer: Self-pay | Admitting: Family Medicine

## 2017-04-15 DIAGNOSIS — Z87891 Personal history of nicotine dependence: Secondary | ICD-10-CM

## 2017-05-07 ENCOUNTER — Ambulatory Visit
Admission: RE | Admit: 2017-05-07 | Discharge: 2017-05-07 | Disposition: A | Discharge status: Home / Self Care | Payer: 59 | Source: Ambulatory Visit | Attending: Family Medicine | Admitting: Family Medicine

## 2017-05-07 DIAGNOSIS — Z87891 Personal history of nicotine dependence: Secondary | ICD-10-CM | POA: Diagnosis not present

## 2017-05-07 DIAGNOSIS — Z136 Encounter for screening for cardiovascular disorders: Secondary | ICD-10-CM | POA: Diagnosis not present

## 2017-05-07 DIAGNOSIS — R918 Other nonspecific abnormal finding of lung field: Secondary | ICD-10-CM | POA: Diagnosis not present

## 2017-07-11 MED FILL — SIMVASTATIN 40 MG TABLET: 40 | 90 days supply | Qty: 90 | Fill #0

## 2017-07-11 MED FILL — LOSARTAN POTASSIUM 50 MG TA: 50 | 90 days supply | Qty: 90 | Fill #0

## 2017-08-19 DIAGNOSIS — H52223 Regular astigmatism, bilateral: Secondary | ICD-10-CM | POA: Diagnosis not present

## 2017-08-19 DIAGNOSIS — H524 Presbyopia: Secondary | ICD-10-CM | POA: Diagnosis not present

## 2017-08-19 DIAGNOSIS — H5203 Hypermetropia, bilateral: Secondary | ICD-10-CM | POA: Diagnosis not present

## 2017-09-25 DIAGNOSIS — L309 Dermatitis, unspecified: Secondary | ICD-10-CM | POA: Diagnosis not present

## 2017-09-27 MED FILL — LOSARTAN POTASSIUM 50 MG TA: 50 | 90 days supply | Qty: 90 | Fill #1

## 2017-09-27 MED FILL — SIMVASTATIN 40 MG TABLET: 40 | 90 days supply | Qty: 90 | Fill #1

## 2017-12-30 DIAGNOSIS — J0100 Acute maxillary sinusitis, unspecified: Secondary | ICD-10-CM | POA: Diagnosis not present

## 2017-12-30 MED FILL — IPRATROPIUM 0.06% SPRAY: 0.06 | 10 days supply | Qty: 15 | Fill #0

## 2017-12-30 MED FILL — AMOXICILLIN 875 MG TABLET: 875 | 10 days supply | Qty: 20 | Fill #0

## 2018-01-07 MED FILL — LOSARTAN POTASSIUM 25 MG TA: 25 | 90 days supply | Qty: 90 | Fill #0

## 2018-01-07 MED FILL — SIMVASTATIN 40 MG TABLET: 40 | 90 days supply | Qty: 90 | Fill #2

## 2018-01-08 MED FILL — NAPROXEN 500 MG TABLET: 500 | 4 days supply | Qty: 12 | Fill #0

## 2018-01-08 MED FILL — AMOX-CLAV 875-125 MG TABLET: 875-125 | 7 days supply | Qty: 14 | Fill #0

## 2018-01-08 MED FILL — HYDROCODON-APAP 7.5-325: 7.5-325 | 3 days supply | Qty: 10 | Fill #0

## 2018-03-26 MED FILL — SIMVASTATIN 40 MG TABLET: 40 | 90 days supply | Qty: 90 | Fill #3

## 2018-03-26 MED FILL — LOSARTAN POTASSIUM 25 MG TA: 25 | 90 days supply | Qty: 90 | Fill #1

## 2018-04-21 DIAGNOSIS — Z87891 Personal history of nicotine dependence: Secondary | ICD-10-CM | POA: Diagnosis not present

## 2018-04-21 DIAGNOSIS — Z131 Encounter for screening for diabetes mellitus: Secondary | ICD-10-CM | POA: Diagnosis not present

## 2018-04-21 DIAGNOSIS — Z0000 Encounter for general adult medical examination without abnormal findings: Secondary | ICD-10-CM | POA: Diagnosis not present

## 2018-04-21 DIAGNOSIS — L853 Xerosis cutis: Secondary | ICD-10-CM | POA: Diagnosis not present

## 2018-04-21 DIAGNOSIS — Z23 Encounter for immunization: Secondary | ICD-10-CM | POA: Diagnosis not present

## 2018-04-21 DIAGNOSIS — I10 Essential (primary) hypertension: Secondary | ICD-10-CM | POA: Diagnosis not present

## 2018-04-21 DIAGNOSIS — H9313 Tinnitus, bilateral: Secondary | ICD-10-CM | POA: Diagnosis not present

## 2018-04-21 DIAGNOSIS — F17210 Nicotine dependence, cigarettes, uncomplicated: Secondary | ICD-10-CM | POA: Diagnosis not present

## 2018-04-21 DIAGNOSIS — E7800 Pure hypercholesterolemia, unspecified: Secondary | ICD-10-CM | POA: Diagnosis not present

## 2018-04-22 MED FILL — SHINGRIX 50 MCG SUS: 50 | 1 days supply | Qty: 1 | Fill #0

## 2018-04-24 ENCOUNTER — Other Ambulatory Visit: Payer: Self-pay | Admitting: Family Medicine

## 2018-04-24 DIAGNOSIS — Z87891 Personal history of nicotine dependence: Secondary | ICD-10-CM

## 2018-04-24 DIAGNOSIS — F17210 Nicotine dependence, cigarettes, uncomplicated: Secondary | ICD-10-CM

## 2018-04-30 ENCOUNTER — Other Ambulatory Visit: Payer: Self-pay | Admitting: Family Medicine

## 2018-04-30 ENCOUNTER — Ambulatory Visit
Admission: RE | Admit: 2018-04-30 | Discharge: 2018-04-30 | Disposition: A | Discharge status: Home / Self Care | Payer: 59 | Source: Ambulatory Visit | Attending: Family Medicine | Admitting: Family Medicine

## 2018-04-30 DIAGNOSIS — J439 Emphysema, unspecified: Secondary | ICD-10-CM | POA: Diagnosis not present

## 2018-04-30 DIAGNOSIS — Z87891 Personal history of nicotine dependence: Secondary | ICD-10-CM

## 2018-04-30 DIAGNOSIS — F17210 Nicotine dependence, cigarettes, uncomplicated: Secondary | ICD-10-CM

## 2018-06-25 MED FILL — SIMVASTATIN 40 MG TABLET: 40 | 90 days supply | Qty: 90 | Fill #0

## 2018-06-25 MED FILL — LOSARTAN POTASSIUM 25 MG TA: 25 | 90 days supply | Qty: 90 | Fill #2

## 2018-06-26 MED FILL — NAPROXEN 500 MG TABLET: 500 | 10 days supply | Qty: 20 | Fill #0

## 2018-06-26 MED FILL — CYCLOBENZAPRINE 10 MG TAB: 10 | 7 days supply | Qty: 20 | Fill #0

## 2018-07-14 MED FILL — SHINGRIX 50 MCG SUS: 50 | 1 days supply | Qty: 1 | Fill #0

## 2018-10-01 MED FILL — SIMVASTATIN 40 MG TABLET: 40 | 90 days supply | Qty: 90 | Fill #1

## 2018-10-01 MED FILL — LOSARTAN POTASSIUM 25 MG TA: 25 | 90 days supply | Qty: 90 | Fill #3

## 2018-10-03 MED FILL — SHINGRIX 50 MCG SUS: 50 | 1 days supply | Qty: 1 | Fill #1

## 2018-10-21 DIAGNOSIS — Z23 Encounter for immunization: Secondary | ICD-10-CM | POA: Diagnosis not present

## 2019-01-02 MED FILL — SIMVASTATIN 40 MG TABLET: 40 | 90 days supply | Qty: 90 | Fill #2

## 2019-01-02 MED FILL — LOSARTAN POTASSIUM 25 MG TA: 25 | 90 days supply | Qty: 90 | Fill #0

## 2019-02-11 DIAGNOSIS — H5203 Hypermetropia, bilateral: Secondary | ICD-10-CM | POA: Diagnosis not present

## 2019-02-11 DIAGNOSIS — H52223 Regular astigmatism, bilateral: Secondary | ICD-10-CM | POA: Diagnosis not present

## 2019-02-11 DIAGNOSIS — H524 Presbyopia: Secondary | ICD-10-CM | POA: Diagnosis not present

## 2019-03-30 MED FILL — LOSARTAN POTASSIUM 25 MG TA: 25 | 90 days supply | Qty: 90 | Fill #1

## 2019-03-31 MED FILL — SIMVASTATIN 40 MG TABLET: 40 | 90 days supply | Qty: 90 | Fill #0

## 2019-04-08 DIAGNOSIS — H43811 Vitreous degeneration, right eye: Secondary | ICD-10-CM | POA: Diagnosis not present

## 2019-04-08 DIAGNOSIS — H2513 Age-related nuclear cataract, bilateral: Secondary | ICD-10-CM | POA: Diagnosis not present

## 2019-04-08 DIAGNOSIS — H35412 Lattice degeneration of retina, left eye: Secondary | ICD-10-CM | POA: Diagnosis not present

## 2019-05-18 ENCOUNTER — Other Ambulatory Visit: Payer: Self-pay | Admitting: Family Medicine

## 2019-05-18 DIAGNOSIS — F17200 Nicotine dependence, unspecified, uncomplicated: Secondary | ICD-10-CM

## 2019-06-01 ENCOUNTER — Ambulatory Visit
Admission: RE | Admit: 2019-06-01 | Discharge: 2019-06-01 | Disposition: A | Discharge status: Home / Self Care | Payer: Medicare Other | Source: Ambulatory Visit | Attending: Family Medicine | Admitting: Family Medicine

## 2019-06-01 DIAGNOSIS — F17200 Nicotine dependence, unspecified, uncomplicated: Secondary | ICD-10-CM

## 2019-06-03 ENCOUNTER — Ambulatory Visit: Payer: 59

## 2020-05-04 DIAGNOSIS — L298 Other pruritus: Secondary | ICD-10-CM | POA: Diagnosis not present

## 2020-05-04 DIAGNOSIS — L218 Other seborrheic dermatitis: Secondary | ICD-10-CM | POA: Diagnosis not present

## 2020-05-04 DIAGNOSIS — L819 Disorder of pigmentation, unspecified: Secondary | ICD-10-CM | POA: Diagnosis not present

## 2020-05-24 DIAGNOSIS — F17210 Nicotine dependence, cigarettes, uncomplicated: Secondary | ICD-10-CM | POA: Diagnosis not present

## 2020-05-24 DIAGNOSIS — I10 Essential (primary) hypertension: Secondary | ICD-10-CM | POA: Diagnosis not present

## 2020-05-24 DIAGNOSIS — Z0000 Encounter for general adult medical examination without abnormal findings: Secondary | ICD-10-CM | POA: Diagnosis not present

## 2020-05-24 DIAGNOSIS — R7309 Other abnormal glucose: Secondary | ICD-10-CM | POA: Diagnosis not present

## 2020-05-24 DIAGNOSIS — E7800 Pure hypercholesterolemia, unspecified: Secondary | ICD-10-CM | POA: Diagnosis not present

## 2020-06-10 ENCOUNTER — Telehealth: Payer: Self-pay | Admitting: Acute Care

## 2020-06-10 DIAGNOSIS — Z87891 Personal history of nicotine dependence: Secondary | ICD-10-CM

## 2020-06-13 NOTE — Telephone Encounter (Signed)
Spoke with pt and scheduled SDMV 07/20/20 10:00 CT ordered and will be scheduled

## 2020-07-18 ENCOUNTER — Telehealth: Payer: Self-pay | Admitting: Acute Care

## 2020-07-19 NOTE — Telephone Encounter (Signed)
Nothing noted in message. Will close encounter.  

## 2020-07-20 ENCOUNTER — Ambulatory Visit
Admission: RE | Admit: 2020-07-20 | Discharge: 2020-07-20 | Disposition: A | Discharge status: Home / Self Care | Payer: Medicare Other | Source: Ambulatory Visit | Attending: Acute Care | Admitting: Acute Care

## 2020-07-20 ENCOUNTER — Other Ambulatory Visit: Payer: Self-pay

## 2020-07-20 ENCOUNTER — Ambulatory Visit (INDEPENDENT_AMBULATORY_CARE_PROVIDER_SITE_OTHER): Payer: Medicare Other | Admitting: Acute Care

## 2020-07-20 ENCOUNTER — Encounter: Payer: Self-pay | Admitting: Acute Care

## 2020-07-20 VITALS — BP 122/74 | HR 65 | Temp 97.8°F | Ht 70.0 in | Wt 205.0 lb

## 2020-07-20 DIAGNOSIS — Z87891 Personal history of nicotine dependence: Secondary | ICD-10-CM | POA: Diagnosis not present

## 2020-07-20 DIAGNOSIS — Z122 Encounter for screening for malignant neoplasm of respiratory organs: Secondary | ICD-10-CM

## 2020-07-20 NOTE — Patient Instructions (Signed)
Thank you for participating in the [facility]. It was our pleasure to meet you today. We will call you with the results of your scan within the next few days. Your scan will be assigned a Lung RADS category score by the physicians reading the scans.  This Lung RADS score determines follow up scanning.  See below for description of categories, and follow up screening recommendations. We will be in touch to schedule your follow up screening annually or based on recommendations of our providers. We will fax a copy of your scan results to your Primary Care Physician, or the physician who referred you to the program, to ensure they have the results. Please call the office if you have any questions or concerns regarding your scanning experience or results.  Our office number is [phone]. Please speak with Denise Phelps, RN. She is our Lung Cancer Screening RN. If she is unavailable when you call, please have the office staff send her a message. She will return your call at her earliest convenience. Remember, if your scan is normal, we will scan you annually as long as you continue to meet the criteria for the program. (Age 55-77, Current smoker or smoker who has quit within the last 15 years). If you are a smoker, remember, quitting is the single most powerful action that you can take to decrease your risk of lung cancer and other pulmonary, breathing related problems. We know quitting is hard, and we are here to help.  Please let us know if there is anything we can do to help you meet your goal of quitting. If you are a former smoker, congratulations. We are proud of you! Remain smoke free! Remember you can refer friends or family members through the number above.  We will screen them to make sure they meet criteria for the program. Thank you for helping us take better care of you by participating in Lung Screening.  Lung RADS Categories:  Lung RADS 1: no nodules  or definitely non-concerning nodules.  Recommendation is for a repeat annual scan in 12 months.  Lung RADS 2:  nodules that are non-concerning in appearance and behavior with a very low likelihood of becoming an active cancer. Recommendation is for a repeat annual scan in 12 months.  Lung RADS 3: nodules that are probably non-concerning , includes nodules with a low likelihood of becoming an active cancer.  Recommendation is for a 6-month repeat screening scan. Often noted after an upper respiratory illness. We will be in touch to make sure you have no questions, and to schedule your 6-month scan.  Lung RADS 4 A: nodules with concerning findings, recommendation is most often for a follow up scan in 3 months or additional testing based on our provider's assessment of the scan. We will be in touch to make sure you have no questions and to schedule the recommended 3 month follow up scan.  Lung RADS 4 B:  indicates findings that are concerning. We will be in touch with you to schedule additional diagnostic testing based on our provider's  assessment of the scan.   

## 2020-07-20 NOTE — Progress Notes (Signed)
Shared Decision Making Visit Lung Cancer Screening Program [id number])   Eligibility:  Age 70 y.o.  Pack Years Smoking History Calculation 30 pack year smoking history (# packs/per year x # years smoked)  Recent History of coughing up blood  no  Unexplained weight loss? no ( >Than 15 pounds within the last 6 months )  Prior History Lung / other cancer no (Diagnosis within the last 5 years already requiring surveillance chest CT Scans).  Smoking Status Former Smoker  Former Smokers: Years since quit: 5 years  Quit Date: 2017  Visit Components:  Discussion included one or more decision making aids. yes  Discussion included risk/benefits of screening. yes  Discussion included potential follow up diagnostic testing for abnormal scans. yes  Discussion included meaning and risk of over diagnosis. yes  Discussion included meaning and risk of False Positives. yes  Discussion included meaning of total radiation exposure. yes  Counseling Included:  Importance of adherence to annual lung cancer LDCT screening. yes  Impact of comorbidities on ability to participate in the program. yes  Ability and willingness to under diagnostic treatment. yes  Smoking Cessation Counseling:  Current Smokers:   Discussed importance of smoking cessation. yes  Information about tobacco cessation classes and interventions provided to patient. yes  Patient provided with "ticket" for LDCT Scan. yes  Symptomatic Patient. no  Counseling  Diagnosis Code: Tobacco Use Z72.0  Asymptomatic Patient yes  Counseling (Intermediate counseling: > three minutes counseling) U0454  Former Smokers:   Discussed the importance of maintaining cigarette abstinence. yes  Diagnosis Code: Personal History of Nicotine Dependence. U98.119  Information about tobacco cessation classes and interventions provided to patient. Yes  Patient provided with "ticket" for LDCT Scan. yes  Written Order for Lung Cancer  Screening with LDCT placed in Epic. Yes (CT Chest Lung Cancer Screening Low Dose W/O CM) [id number] Z12.2-Screening of respiratory organs Z87.891-Personal history of nicotine dependence  I spent 25 minutes of face to face time with Evan [name] discussing the risks and benefits of lung cancer screening. We viewed a power point together that explained in detail the above noted topics. We took the time to pause the power point at intervals to allow for questions to be asked and answered to ensure understanding. We discussed that him had taken the single most powerful action possible to decrease his risk of developing lung cancer when he quit smoking. I counseled him to remain smoke free, and to contact me if he ever had the desire to smoke again so that I can provide resources and tools to help support the effort to remain smoke free. We discussed the time and location of the scan, and that either  [name] or I will call with the results within  24-48 hours of receiving them. He has my card and contact information in the event he needs to speak with me, in addition to a copy of the power point we reviewed as a resource. He verbalized understanding of all of the above and had no further questions upon leaving the office.     I explained to the patient that there has been a high incidence of coronary artery disease noted on these exams. I explained that this is a non-gated exam therefore degree or severity cannot be determined. This patient is currently on statin therapy. I have asked the patient to follow-up with their PCP regarding any incidental finding of coronary artery disease and management with diet or medication as they feel is clinically  indicated. The patient verbalized understanding of the above and had no further questions.     Evan Spatz, NP 07/20/2020

## 2020-08-02 ENCOUNTER — Other Ambulatory Visit: Payer: Self-pay | Admitting: *Deleted

## 2020-08-02 DIAGNOSIS — Z87891 Personal history of nicotine dependence: Secondary | ICD-10-CM

## 2020-08-02 NOTE — Progress Notes (Signed)
Please call patient and let them  know their  low dose Ct was read as a Lung RADS 2: nodules that are benign in appearance and behavior with a very low likelihood of becoming a clinically active cancer due to size or lack of growth. Recommendation per radiology is for a repeat LDCT in 12 months. .Please let them  know we will order and schedule their  annual screening scan for 07/2021. Please let them  know there was notation of CAD on their  scan.  Please remind the patient  that this is a non-gated exam therefore degree or severity of disease  cannot be determined. Please have them  follow up with their PCP regarding potential risk factor modification, dietary therapy or pharmacologic therapy if clinically indicated. Pt.  is  currently on statin therapy. Please place order for annual  screening scan for  07/2021 and fax results to PCP. Thanks so much.  Please have patient call PCP regarding CAD, and aortic valve calcifications. Radiology recommends an echo if he has not already had one.Thanks so much

## 2020-10-26 DIAGNOSIS — U071 COVID-19: Secondary | ICD-10-CM | POA: Diagnosis not present

## 2020-10-26 DIAGNOSIS — R059 Cough, unspecified: Secondary | ICD-10-CM | POA: Diagnosis not present

## 2020-10-26 DIAGNOSIS — R509 Fever, unspecified: Secondary | ICD-10-CM | POA: Diagnosis not present

## 2020-10-26 DIAGNOSIS — R5383 Other fatigue: Secondary | ICD-10-CM | POA: Diagnosis not present

## 2020-11-07 DIAGNOSIS — D1801 Hemangioma of skin and subcutaneous tissue: Secondary | ICD-10-CM | POA: Diagnosis not present

## 2020-11-07 DIAGNOSIS — L821 Other seborrheic keratosis: Secondary | ICD-10-CM | POA: Diagnosis not present

## 2020-11-07 DIAGNOSIS — D225 Melanocytic nevi of trunk: Secondary | ICD-10-CM | POA: Diagnosis not present

## 2020-11-07 DIAGNOSIS — L570 Actinic keratosis: Secondary | ICD-10-CM | POA: Diagnosis not present

## 2020-11-07 DIAGNOSIS — L819 Disorder of pigmentation, unspecified: Secondary | ICD-10-CM | POA: Diagnosis not present

## 2020-11-07 DIAGNOSIS — L814 Other melanin hyperpigmentation: Secondary | ICD-10-CM | POA: Diagnosis not present

## 2020-11-22 DIAGNOSIS — R7309 Other abnormal glucose: Secondary | ICD-10-CM | POA: Diagnosis not present

## 2020-11-30 ENCOUNTER — Other Ambulatory Visit (HOSPITAL_BASED_OUTPATIENT_CLINIC_OR_DEPARTMENT_OTHER): Payer: Self-pay

## 2020-11-30 MED ORDER — TETANUS-DIPHTH-ACELL PERTUSSIS 5-2.5-18.5 LF-MCG/0.5 IM SUSY
PREFILLED_SYRINGE | INTRAMUSCULAR | 0 refills | Status: DC
  Filled 2020-11-30: qty 0.5, 1d supply, fill #0

## 2021-01-09 DIAGNOSIS — T63421A Toxic effect of venom of ants, accidental (unintentional), initial encounter: Secondary | ICD-10-CM | POA: Diagnosis not present

## 2021-01-09 DIAGNOSIS — S80861A Insect bite (nonvenomous), right lower leg, initial encounter: Secondary | ICD-10-CM | POA: Diagnosis not present

## 2021-01-09 DIAGNOSIS — L538 Other specified erythematous conditions: Secondary | ICD-10-CM | POA: Diagnosis not present

## 2021-01-09 DIAGNOSIS — S80862A Insect bite (nonvenomous), left lower leg, initial encounter: Secondary | ICD-10-CM | POA: Diagnosis not present

## 2021-05-12 DIAGNOSIS — I10 Essential (primary) hypertension: Secondary | ICD-10-CM | POA: Diagnosis not present

## 2021-05-12 DIAGNOSIS — L570 Actinic keratosis: Secondary | ICD-10-CM | POA: Diagnosis not present

## 2021-05-19 DIAGNOSIS — C44622 Squamous cell carcinoma of skin of right upper limb, including shoulder: Secondary | ICD-10-CM | POA: Diagnosis not present

## 2021-05-19 DIAGNOSIS — D485 Neoplasm of uncertain behavior of skin: Secondary | ICD-10-CM | POA: Diagnosis not present

## 2021-06-06 DIAGNOSIS — I10 Essential (primary) hypertension: Secondary | ICD-10-CM | POA: Diagnosis not present

## 2021-06-06 DIAGNOSIS — F17210 Nicotine dependence, cigarettes, uncomplicated: Secondary | ICD-10-CM | POA: Diagnosis not present

## 2021-06-06 DIAGNOSIS — Z79899 Other long term (current) drug therapy: Secondary | ICD-10-CM | POA: Diagnosis not present

## 2021-06-06 DIAGNOSIS — R7301 Impaired fasting glucose: Secondary | ICD-10-CM | POA: Diagnosis not present

## 2021-06-06 DIAGNOSIS — J439 Emphysema, unspecified: Secondary | ICD-10-CM | POA: Diagnosis not present

## 2021-06-06 DIAGNOSIS — E7800 Pure hypercholesterolemia, unspecified: Secondary | ICD-10-CM | POA: Diagnosis not present

## 2021-06-06 DIAGNOSIS — Z0001 Encounter for general adult medical examination with abnormal findings: Secondary | ICD-10-CM | POA: Diagnosis not present

## 2021-06-06 DIAGNOSIS — R7309 Other abnormal glucose: Secondary | ICD-10-CM | POA: Diagnosis not present

## 2021-06-06 DIAGNOSIS — I2510 Atherosclerotic heart disease of native coronary artery without angina pectoris: Secondary | ICD-10-CM | POA: Diagnosis not present

## 2021-06-06 DIAGNOSIS — R413 Other amnesia: Secondary | ICD-10-CM | POA: Diagnosis not present

## 2021-06-06 DIAGNOSIS — I700 Atherosclerosis of aorta: Secondary | ICD-10-CM | POA: Diagnosis not present

## 2021-06-08 ENCOUNTER — Other Ambulatory Visit: Payer: Self-pay | Admitting: Family Medicine

## 2021-06-08 DIAGNOSIS — R413 Other amnesia: Secondary | ICD-10-CM

## 2021-06-09 ENCOUNTER — Other Ambulatory Visit: Payer: Self-pay

## 2021-06-09 ENCOUNTER — Ambulatory Visit
Admission: RE | Admit: 2021-06-09 | Discharge: 2021-06-09 | Disposition: A | Discharge status: Home / Self Care | Payer: Medicare Other | Source: Ambulatory Visit | Attending: Family Medicine | Admitting: Family Medicine

## 2021-06-09 DIAGNOSIS — R42 Dizziness and giddiness: Secondary | ICD-10-CM | POA: Diagnosis not present

## 2021-06-09 DIAGNOSIS — R413 Other amnesia: Secondary | ICD-10-CM

## 2021-07-12 ENCOUNTER — Encounter: Payer: Self-pay | Admitting: Cardiology

## 2021-07-12 ENCOUNTER — Ambulatory Visit: Payer: Medicare Other | Admitting: Cardiology

## 2021-07-12 ENCOUNTER — Encounter: Payer: Self-pay | Admitting: *Deleted

## 2021-07-12 VITALS — BP 106/70 | HR 88 | Ht 70.0 in | Wt 206.6 lb

## 2021-07-12 DIAGNOSIS — Z79899 Other long term (current) drug therapy: Secondary | ICD-10-CM

## 2021-07-12 DIAGNOSIS — E7800 Pure hypercholesterolemia, unspecified: Secondary | ICD-10-CM

## 2021-07-12 DIAGNOSIS — Z8249 Family history of ischemic heart disease and other diseases of the circulatory system: Secondary | ICD-10-CM | POA: Diagnosis not present

## 2021-07-12 DIAGNOSIS — I4510 Unspecified right bundle-branch block: Secondary | ICD-10-CM | POA: Diagnosis not present

## 2021-07-12 DIAGNOSIS — I2510 Atherosclerotic heart disease of native coronary artery without angina pectoris: Secondary | ICD-10-CM | POA: Diagnosis not present

## 2021-07-12 MED ORDER — ASPIRIN EC 81 MG PO TBEC: 81.0000 mg | DELAYED_RELEASE_TABLET | Freq: Every day | ORAL | 3 refills | Status: AC

## 2021-07-12 MED ORDER — ROSUVASTATIN CALCIUM 40 MG PO TABS: 40.0000 mg | ORAL_TABLET | Freq: Every day | ORAL | 3 refills | Status: AC

## 2021-07-12 NOTE — Assessment & Plan Note (Signed)
Stop simvastatin 40 mg and start Crestor 40 mg once a day.  Repeat lipid panel and ALT in 3 months ?

## 2021-07-12 NOTE — Patient Instructions (Signed)
Medication Instructions:  ?Please discontinue your Simvastatin and start Crestor 40 mg a day. ?Continue all other medications as listed. ? ?*If you need a refill on your cardiac medications before your next appointment, please call your pharmacy* ? ?Lab Work: ?Please have blood work in 3 months (Lipid/ALT) ? ?If you have labs (blood work) drawn today and your tests are completely normal, you will receive your results only by: ?MyChart Message (if you have MyChart) OR ?A paper copy in the mail ?If you have any lab test that is abnormal or we need to change your treatment, we will call you to review the results. ? ?Testing/Procedures: ?Your physician has requested that you have a lexiscan myoview. For further information please visit [email or web address]. Please follow instruction sheet, as given. ? ?Follow-Up: ?At [facility], you and your health needs are our priority.  As part of our continuing mission to provide you with exceptional heart care, we have created designated Provider Care Teams.  These Care Teams include your primary Cardiologist (physician) and Advanced Practice Providers (APPs -  Physician Assistants and Nurse Practitioners) who all work together to provide you with the care you need, when you need it. ? ?We recommend signing up for the patient portal called "MyChart".  Sign up information is provided on this After Visit Summary.  MyChart is used to connect with patients for Virtual Visits (Telemedicine).  Patients are able to view lab/test results, encounter notes, upcoming appointments, etc.  Non-urgent messages can be sent to your provider as well.   ?To learn more about what you can do with MyChart, go to [email or web address].   ? ?Your next appointment:   ?1 year(s) ? ?The format for your next appointment:   ?In Person ? ?Provider:   ?Dr Candee Furbish ? ?If primary card or EP is not listed click here to update    :1}  ? ?Thank you for choosing [facility]!! ? ? ? ?

## 2021-07-12 NOTE — Assessment & Plan Note (Addendum)
Given his multivessel coronary artery disease and significant coronary artery calcification, his calcium score would be greater than 400,  we will check a Lexiscan stress test to make sure there is no high risk evidence of ischemia.  Definitely there can be a situation where balanced ischemia could be present.  We will pay attention to TID. ? ?Go ahead and start aspirin 81 mg. ?

## 2021-07-12 NOTE — Assessment & Plan Note (Signed)
Stable RBBB, no syncope ?

## 2021-07-12 NOTE — Assessment & Plan Note (Signed)
Strong family history of CAD. ?

## 2021-07-12 NOTE — Progress Notes (Signed)
?Cardiology Office Note:   ? ?Date:  07/12/2021  ? ?ID:  Evan [name], DOB 1951-01-24, MRN [id number] ? ?PCP:  Evan Amel, MD ?  ?[facility] ?Cardiologist:  Evan Furbish, MD    ? ?Referring MD: Evan Amel, MD  ? ? ?History of Present Illness:   ? ?Evan [name] is a 71 y.o. male here for the evaluation of coronary artery disease, aortic atherosclerosis at the request of Evan [name].  ? ?Mother died of aursynm at age 45. Broghter had CABG, [name], smoked, agent orage.  ? ?No real pain. Mild dizziness when eyes closed. No chest pain.  ? ?Multivessel coronary artery disease seen on CT chest 2022 personally reviewed and interpreted. ?Prior stress test in 2018 that was low risk with no ischemia. ?Past Medical History:  ?Diagnosis Date  ? Aortic atherosclerosis ([place])   ? CAD (coronary artery disease)   ? Diverticulosis   ? Emphysema lung ([place])   ? Hypercholesteremia   ? Hyperlipemia   ? Hypertension   ? Memory changes   ? Smoker   ? Tinnitus of both ears   ? ? ?Past Surgical History:  ?Procedure Laterality Date  ? HAND SURGERY    ? multiple reconstructive surgeries   ? HEMORRHOID SURGERY  01/23/11  ? HEMORRHOID SURGERY    ? HERNIA REPAIR    ? LEG SURGERY    ? ? ?Current Medications: ?Current Meds  ?Medication Sig  ? acetaminophen (TYLENOL) 500 MG tablet Take 1,000 mg by mouth every 6 (six) hours as needed.  ? aspirin EC 81 MG tablet Take 1 tablet (81 mg total) by mouth daily. Swallow whole.  ? b complex vitamins capsule Take 1 capsule by mouth daily.  ? Cyanocobalamin (VITAMIN B 12) 500 MCG TABS Take by mouth.  ? losartan (COZAAR) 50 MG tablet Take 50 mg by mouth at bedtime.  ? Multiple Vitamins-Minerals (MULTIVITAMIN MEN 50+ PO) Take by mouth.  ? nicotine polacrilex (NICORETTE) 2 MG gum Take 2 mg by mouth as needed for smoking cessation.  ? Omega-3 Fatty Acids (FISH OIL) 1000 MG CAPS Take 1,000 mg by mouth daily.  ? rosuvastatin (CRESTOR) 40 MG tablet Take 1 tablet (40 mg total) by mouth daily.  ? Tdap  (BOOSTRIX) 5-2.5-18.5 LF-MCG/0.5 injection Inject into the muscle.  ? [DISCONTINUED] simvastatin (ZOCOR) 40 MG tablet Take 40 mg by mouth daily.  ?  ? ?Allergies:   Hydrochlorothiazide and Lisinopril  ? ?Social History  ? ?Socioeconomic History  ? Marital status: Married  ?  Spouse name: Not on file  ? Number of children: Not on file  ? Years of education: Not on file  ? Highest education level: Not on file  ?Occupational History  ? Not on file  ?Tobacco Use  ? Smoking status: Former  ?  Packs/day: 0.00  ?  Types: Cigarettes  ?  Quit date: 08/2015  ?  Years since quitting: 5.9  ? Smokeless tobacco: Never  ?Substance and Sexual Activity  ? Alcohol use: Yes  ? Drug use: No  ? Sexual activity: Not on file  ?Other Topics Concern  ? Not on file  ?Social History Narrative  ? Not on file  ? ?Social Determinants of Health  ? ?Financial Resource Strain: Not on file  ?Food Insecurity: Not on file  ?Transportation Needs: Not on file  ?Physical Activity: Not on file  ?Stress: Not on file  ?Social Connections: Not on file  ?  ? ?Family History: ?As above ? ?ROS:   ?  Please see the history of present illness.    ? All other systems reviewed and are negative. ? ?EKGs/Labs/Other Studies Reviewed:   ? ?The following studies were reviewed today: ?High resolution CT of chest personally reviewed and interpreted, multivessel coronary artery disease present. ? ?EKG:  EKG is  ordered today.  The ekg ordered today demonstrates sinus rhythm right bundle branch block 88 ? ?Recent Labs: ?No results found for requested labs within last 8760 hours.  ?Recent Lipid Panel ?No results found for: CHOL, TRIG, HDL, CHOLHDL, VLDL, LDLCALC, LDLDIRECT ? ? ?Risk Assessment/Calculations:   ? ? ?    ? ?   ? ?Physical Exam:   ? ?VS:  BP 106/70   Pulse 88   Ht '5\' 10"'$  (1.778 m)   Wt 206 lb 9.6 oz (93.7 kg)   SpO2 92%   BMI 29.64 kg/m?    ? ?Wt Readings from Last 3 Encounters:  ?07/12/21 206 lb 9.6 oz (93.7 kg)  ?07/20/20 205 lb (93 kg)  ?06/11/16 216 lb  (98 kg)  ?  ? ?GEN:  Well nourished, well developed in no acute distress ?HEENT: Normal ?NECK: No JVD; No carotid bruits ?LYMPHATICS: No lymphadenopathy ?CARDIAC: RRR, no murmurs, no rubs, gallops ?RESPIRATORY:  Clear to auscultation without rales, wheezing or rhonchi  ?ABDOMEN: Soft, non-tender, non-distended ?MUSCULOSKELETAL:  No edema; left hand reconstruction ?SKIN: Warm and dry ?NEUROLOGIC:  Alert and oriented x 3 ?PSYCHIATRIC:  Normal affect  ? ?ASSESSMENT:   ? ?1. Medication management   ?2. Coronary artery disease involving native coronary artery of native heart without angina pectoris   ?3. Pure hypercholesterolemia   ?4. Family history of early CAD   ?5. RBBB   ? ?PLAN:   ? ?In order of problems listed above: ? ?Coronary artery disease involving native coronary artery of native heart without angina pectoris ?Given his multivessel coronary artery disease and significant coronary artery calcification, his calcium score would be greater than 400,  we will check a Lexiscan stress test to make sure there is no high risk evidence of ischemia.  Definitely there can be a situation where balanced ischemia could be present.  We will pay attention to TID. ? ?Go ahead and start aspirin 81 mg. ? ?Pure hypercholesterolemia ?Stop simvastatin 40 mg and start Crestor 40 mg once a day.  Repeat lipid panel and ALT in 3 months ? ?Family history of early CAD ?Strong family history of CAD. ? ?RBBB ?Stable RBBB, no syncope ?  ? ? ?Shared Decision Making/Informed Consent ?The risks [chest pain, shortness of breath, cardiac arrhythmias, dizziness, blood pressure fluctuations, myocardial infarction, stroke/transient ischemic attack, nausea, vomiting, allergic reaction, radiation exposure, metallic taste sensation and life-threatening complications (estimated to be 1 in 10,000)], benefits (risk stratification, diagnosing coronary artery disease, treatment guidance) and alternatives of a nuclear stress test were discussed in detail  with [name] and he agrees to proceed.  ? ? ?Medication Adjustments/Labs and Tests Ordered: ?Current medicines are reviewed at length with the patient today.  Concerns regarding medicines are outlined above.  ?Orders Placed This Encounter  ?Procedures  ? ALT  ? Lipid panel  ? Cardiac Stress Test: Informed Consent Details: Physician/Practitioner Attestation; Transcribe to consent form and obtain patient signature  ? MYOCARDIAL PERFUSION IMAGING  ? EKG 12-Lead  ? ?Meds ordered this encounter  ?Medications  ? rosuvastatin (CRESTOR) 40 MG tablet  ?  Sig: Take 1 tablet (40 mg total) by mouth daily.  ?  Dispense:  90 tablet  ?  Refill:  3  ? aspirin EC 81 MG tablet  ?  Sig: Take 1 tablet (81 mg total) by mouth daily. Swallow whole.  ?  Dispense:  90 tablet  ?  Refill:  3  ? ? ?Patient Instructions  ?Medication Instructions:  ?Please discontinue your Simvastatin and start Crestor 40 mg a day. ?Continue all other medications as listed. ? ?*If you need a refill on your cardiac medications before your next appointment, please call your pharmacy* ? ?Lab Work: ?Please have blood work in 3 months (Lipid/ALT) ? ?If you have labs (blood work) drawn today and your tests are completely normal, you will receive your results only by: ?MyChart Message (if you have MyChart) OR ?A paper copy in the mail ?If you have any lab test that is abnormal or we need to change your treatment, we will call you to review the results. ? ?Testing/Procedures: ?Your physician has requested that you have a lexiscan myoview. For further information please visit [email or web address]. Please follow instruction sheet, as given. ? ?Follow-Up: ?At [facility], you and your health needs are our priority.  As part of our continuing mission to provide you with exceptional heart care, we have created designated Provider Care Teams.  These Care Teams include your primary Cardiologist (physician) and Advanced Practice Providers (APPs -  Physician Assistants and  Nurse Practitioners) who all work together to provide you with the care you need, when you need it. ? ?We recommend signing up for the patient portal called "MyChart".  Sign up information is provided on this After Vi

## 2021-07-18 DIAGNOSIS — C44622 Squamous cell carcinoma of skin of right upper limb, including shoulder: Secondary | ICD-10-CM | POA: Diagnosis not present

## 2021-07-18 DIAGNOSIS — L905 Scar conditions and fibrosis of skin: Secondary | ICD-10-CM | POA: Diagnosis not present

## 2021-07-19 ENCOUNTER — Encounter (HOSPITAL_COMMUNITY): Payer: Self-pay | Admitting: *Deleted

## 2021-07-27 ENCOUNTER — Ambulatory Visit (HOSPITAL_COMMUNITY): Payer: Medicare Other | Attending: Internal Medicine

## 2021-07-27 DIAGNOSIS — I2510 Atherosclerotic heart disease of native coronary artery without angina pectoris: Secondary | ICD-10-CM | POA: Diagnosis not present

## 2021-07-27 LAB — MYOCARDIAL PERFUSION IMAGING
LV dias vol: 74 mL (ref 62–150)
LV sys vol: 23 mL
Nuc Stress EF: 69 %
Peak HR: 90 {beats}/min
Rest HR: 64 {beats}/min
Rest Nuclear Isotope Dose: 10.1 mCi
SDS: 1
SRS: 0
SSS: 1
ST Depression (mm): 0 mm
Stress Nuclear Isotope Dose: 30.5 mCi
TID: 1.23

## 2021-07-27 MED ORDER — REGADENOSON 0.4 MG/5ML IV SOLN
0.4000 mg | Freq: Once | INTRAVENOUS | Status: AC
  Administered 2021-07-27: 0.4 mg via INTRAVENOUS

## 2021-07-27 MED ORDER — TECHNETIUM TC 99M TETROFOSMIN IV KIT
10.1000 | PACK | Freq: Once | INTRAVENOUS | Status: AC | PRN
  Administered 2021-07-27: 10.1 via INTRAVENOUS
  Filled 2021-07-27: qty 11

## 2021-07-27 MED ORDER — TECHNETIUM TC 99M TETROFOSMIN IV KIT
30.5000 | PACK | Freq: Once | INTRAVENOUS | Status: AC | PRN
  Administered 2021-07-27: 30.5 via INTRAVENOUS
  Filled 2021-07-27: qty 31

## 2021-08-09 DIAGNOSIS — D225 Melanocytic nevi of trunk: Secondary | ICD-10-CM | POA: Diagnosis not present

## 2021-08-09 DIAGNOSIS — L853 Xerosis cutis: Secondary | ICD-10-CM | POA: Diagnosis not present

## 2021-08-09 DIAGNOSIS — L570 Actinic keratosis: Secondary | ICD-10-CM | POA: Diagnosis not present

## 2021-08-09 DIAGNOSIS — L821 Other seborrheic keratosis: Secondary | ICD-10-CM | POA: Diagnosis not present

## 2021-08-09 DIAGNOSIS — L814 Other melanin hyperpigmentation: Secondary | ICD-10-CM | POA: Diagnosis not present

## 2021-08-09 DIAGNOSIS — L568 Other specified acute skin changes due to ultraviolet radiation: Secondary | ICD-10-CM | POA: Diagnosis not present

## 2021-08-31 ENCOUNTER — Other Ambulatory Visit: Payer: Self-pay | Admitting: *Deleted

## 2021-08-31 DIAGNOSIS — H35412 Lattice degeneration of retina, left eye: Secondary | ICD-10-CM | POA: Diagnosis not present

## 2021-08-31 DIAGNOSIS — Z122 Encounter for screening for malignant neoplasm of respiratory organs: Secondary | ICD-10-CM

## 2021-08-31 DIAGNOSIS — H18831 Recurrent erosion of cornea, right eye: Secondary | ICD-10-CM | POA: Diagnosis not present

## 2021-08-31 DIAGNOSIS — H2513 Age-related nuclear cataract, bilateral: Secondary | ICD-10-CM | POA: Diagnosis not present

## 2021-08-31 DIAGNOSIS — H5203 Hypermetropia, bilateral: Secondary | ICD-10-CM | POA: Diagnosis not present

## 2021-08-31 DIAGNOSIS — H43811 Vitreous degeneration, right eye: Secondary | ICD-10-CM | POA: Diagnosis not present

## 2021-08-31 DIAGNOSIS — Z87891 Personal history of nicotine dependence: Secondary | ICD-10-CM

## 2021-09-11 ENCOUNTER — Ambulatory Visit
Admission: RE | Admit: 2021-09-11 | Discharge: 2021-09-11 | Disposition: A | Discharge status: Home / Self Care | Payer: Medicare Other | Source: Ambulatory Visit | Attending: Acute Care | Admitting: Acute Care

## 2021-09-11 DIAGNOSIS — Z87891 Personal history of nicotine dependence: Secondary | ICD-10-CM

## 2021-09-11 DIAGNOSIS — I358 Other nonrheumatic aortic valve disorders: Secondary | ICD-10-CM | POA: Diagnosis not present

## 2021-09-11 DIAGNOSIS — I2510 Atherosclerotic heart disease of native coronary artery without angina pectoris: Secondary | ICD-10-CM | POA: Diagnosis not present

## 2021-09-11 DIAGNOSIS — J432 Centrilobular emphysema: Secondary | ICD-10-CM | POA: Diagnosis not present

## 2021-09-11 DIAGNOSIS — Z122 Encounter for screening for malignant neoplasm of respiratory organs: Secondary | ICD-10-CM

## 2021-09-11 IMAGING — CT CT CHEST LUNG CANCER SCREENING LOW DOSE W/O CM
2 of 5 series · 14 of 40 positions shown, 17 images · non-contrast
Comparison: Low-dose lung cancer screening chest CT [DATE].

CLINICAL DATA: 70-year-old male former smoker (quit 6 years ago)
with 30 pack-year history of smoking. Lung cancer screening
examination.



[Series 4: lung 1.00 br44 cor · coronal · 0.65mm/px · 3 of 319 slices shown]
[im 64/319  lung]
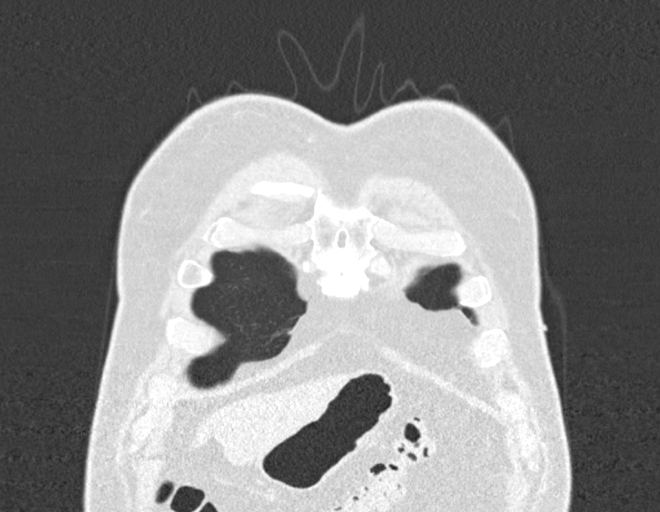
[im 128/319  lung]
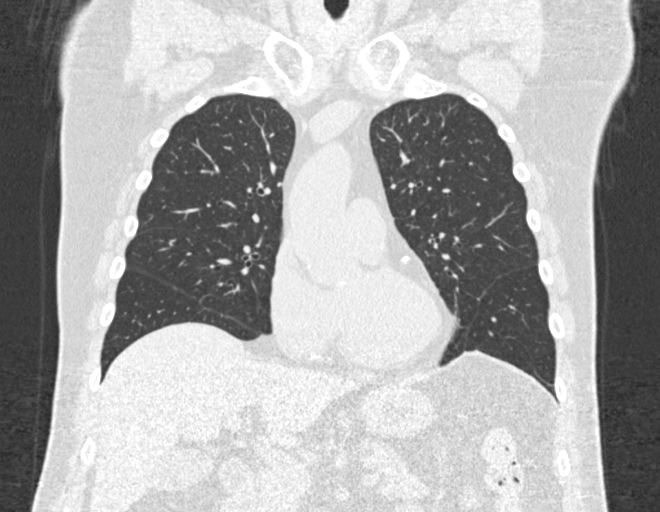
[im 191/319  lung]
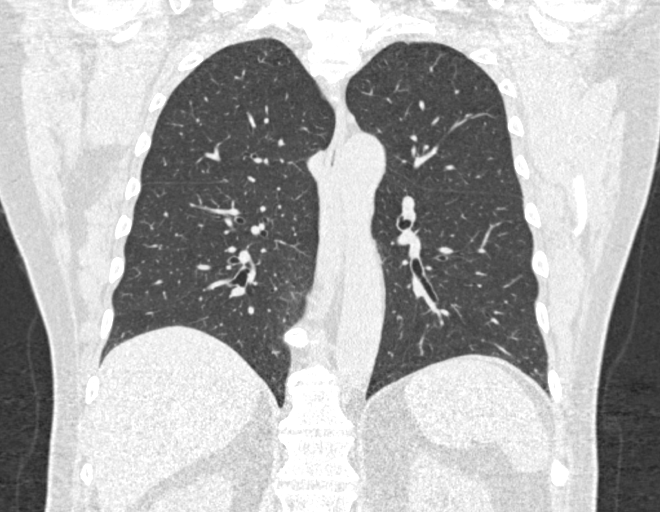

[Series 9: lung 1.00 br60 axial · axial · 0.83mm/px · z∈[-1449,-1149]mm · 11 of 330 slices shown, 14 images]
[im 15/330  mediastinal]
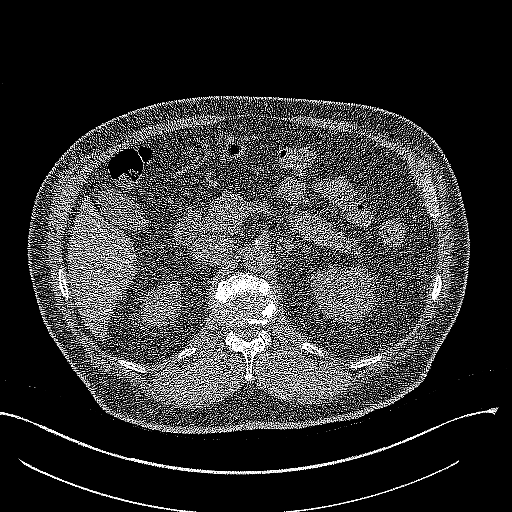
[im 15/330  lung]
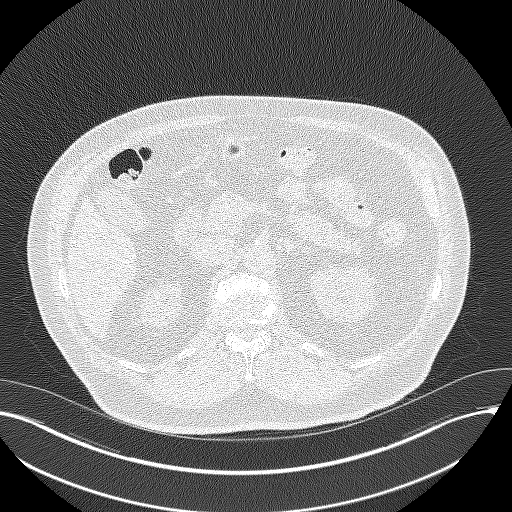
[im 45/330  lung]
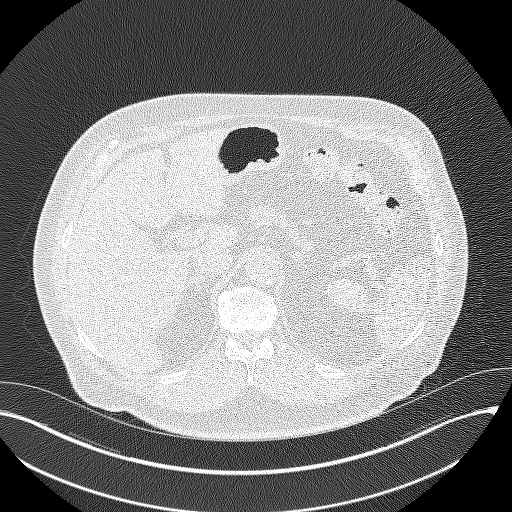
[im 75/330  lung]
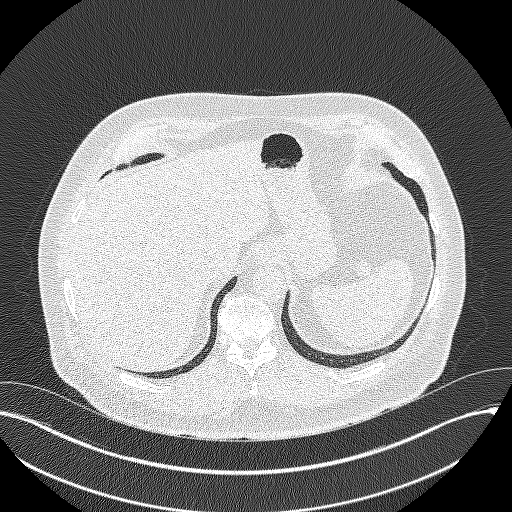
[im 105/330  lung]
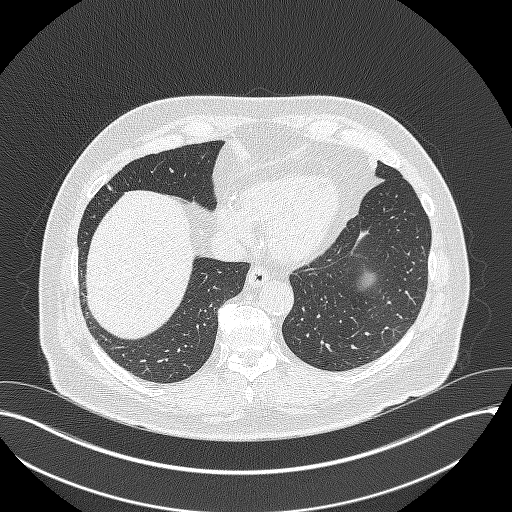
[im 135/330  mediastinal]
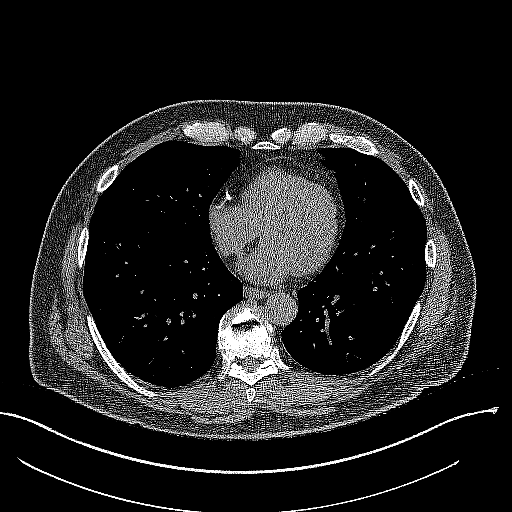
[im 135/330  lung]
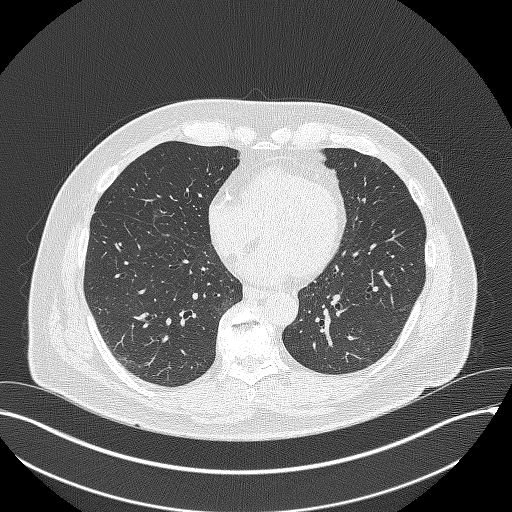
[im 165/330  lung]
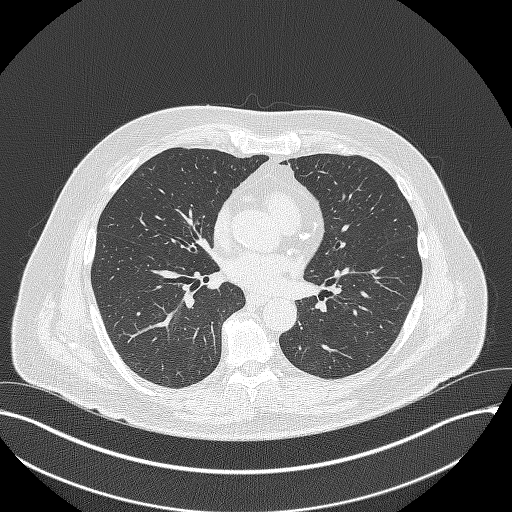
[im 195/330  lung]
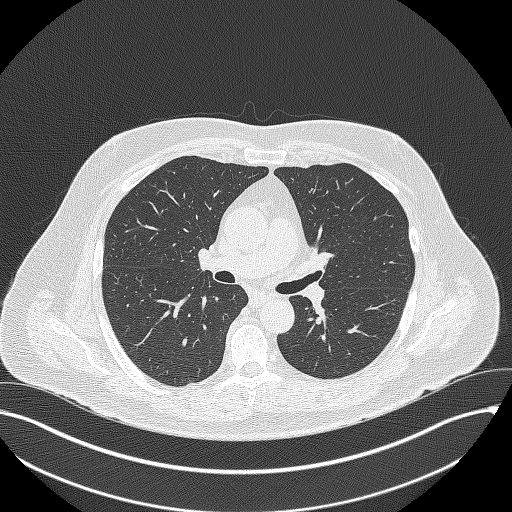
[im 225/330  lung]
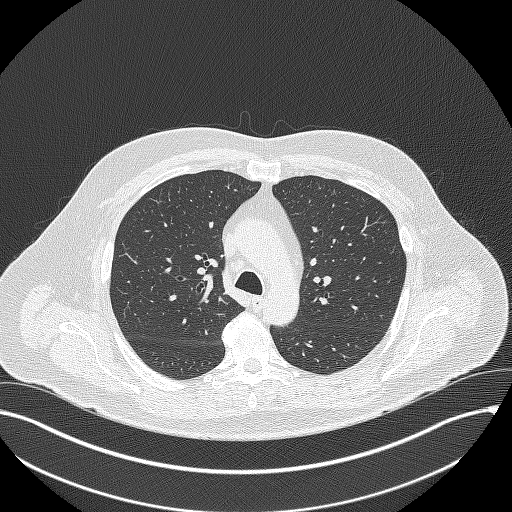
[im 255/330  mediastinal]
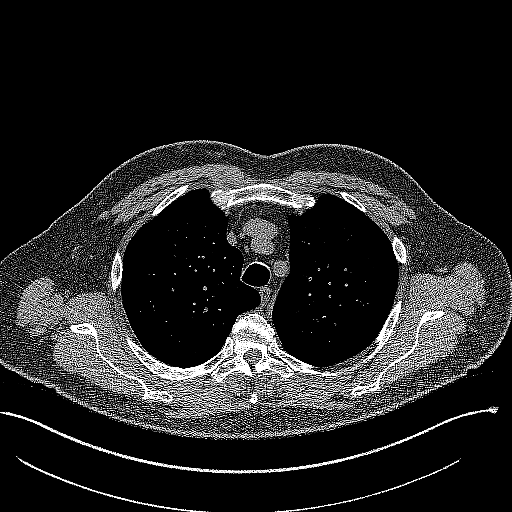
[im 255/330  lung]
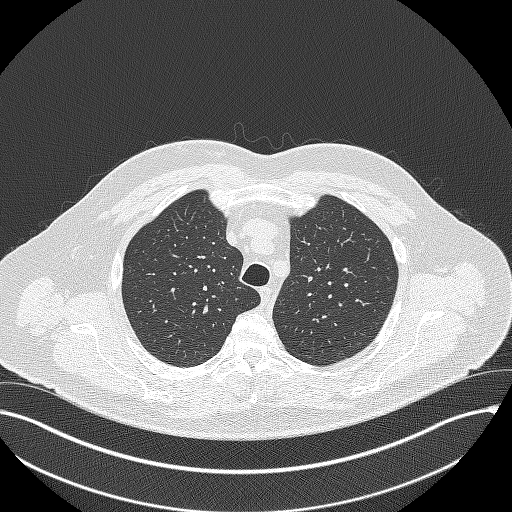
[im 285/330  lung]
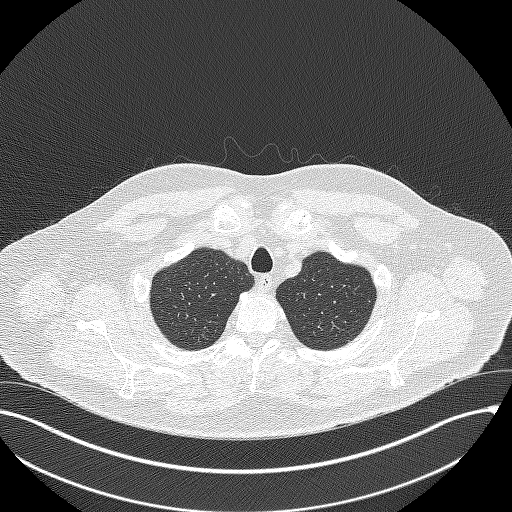
[im 315/330  lung]
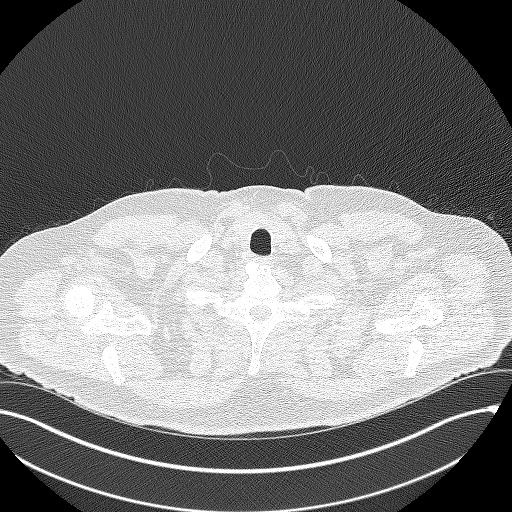

[14 of 40 positions shown; findings below may reference images not displayed]

FINDINGS: Cardiovascular: Heart size is normal. There is no significant
pericardial fluid, thickening or pericardial calcification. There is
aortic atherosclerosis, as well as atherosclerosis of the great
vessels of the mediastinum and the coronary arteries, including
calcified atherosclerotic plaque in the left main, left anterior
descending, left circumflex and right coronary arteries. Mild
calcifications of the aortic valve.

Mediastinum/Nodes: No pathologically enlarged mediastinal or hilar
lymph nodes. Esophagus is unremarkable in appearance. No axillary
lymphadenopathy.

Lungs/Pleura: Tiny pulmonary nodules are again noted in the lungs,
largest of which is in the right middle lobe (axial image 156), with
a volume derived mean diameter of 3.3 mm. No larger more suspicious
appearing pulmonary nodules or masses are noted. No acute
consolidative airspace disease. No pleural effusions. Mild diffuse
bronchial wall thickening with very mild centrilobular and
paraseptal emphysema

Upper Abdomen: Atherosclerotic calcifications in the abdominal
aorta.

Musculoskeletal: There are no aggressive appearing lytic or blastic
lesions noted in the visualized portions of the skeleton.
IMPRESSION: 1. Lung-RADS 2S, benign appearance or behavior. Continue annual
screening with low-dose chest CT without contrast in 12 months.
2. The "S" modifier above refers to potentially clinically
significant non lung cancer related findings. Specifically, there is
aortic atherosclerosis, in addition to left main and three-vessel
coronary artery disease. Please note that although the presence of
coronary artery calcium documents the presence of coronary artery
disease, the severity of this disease and any potential stenosis
cannot be assessed on this non-gated CT examination. Assessment for
potential risk factor modification, dietary therapy or pharmacologic
therapy may be warranted, if clinically indicated.
3. Mild diffuse bronchial wall thickening with very mild
centrilobular and paraseptal emphysema; imaging findings suggestive
of underlying COPD.
4. There are calcifications of the aortic valve. Echocardiographic
correlation for evaluation of potential valvular dysfunction may be
warranted if clinically indicated.

Aortic Atherosclerosis ([U7]-[U7]) and Emphysema ([U7]-[U7]).

## 2021-09-13 ENCOUNTER — Telehealth: Payer: Self-pay | Admitting: Acute Care

## 2021-09-13 ENCOUNTER — Other Ambulatory Visit: Payer: Self-pay

## 2021-09-13 DIAGNOSIS — Z122 Encounter for screening for malignant neoplasm of respiratory organs: Secondary | ICD-10-CM

## 2021-09-13 DIAGNOSIS — Z87891 Personal history of nicotine dependence: Secondary | ICD-10-CM

## 2021-09-13 NOTE — Telephone Encounter (Signed)
Spoke with patient regarding LDCT results.  No suspicious findings for lung cancer.  Atherosclerosis and emphysema, as previously noted.  Also, the aortic valve calcification was again mentioned.  Patient states he did not have any type follow up or discussion regarding this after previous scan 2022.  He has noticed some SOB and fatigue that seemed unusual to him on occasion.  He does see cardiology about once a year.  Would like results faxed to PCP and cardiology for review/recommendation.  Results faxed with note asking cardiology to review and patient would like their recommendation as to whether further testing is needed.  New order placed for annual LDCT. Patient had no further questions

## 2021-09-13 NOTE — Telephone Encounter (Signed)
Left VM and call back information to call to review results of LDCT

## 2021-09-15 ENCOUNTER — Telehealth: Payer: Self-pay | Admitting: *Deleted

## 2021-09-15 DIAGNOSIS — I359 Nonrheumatic aortic valve disorder, unspecified: Secondary | ICD-10-CM

## 2021-09-15 DIAGNOSIS — R0602 Shortness of breath: Secondary | ICD-10-CM

## 2021-09-15 NOTE — Telephone Encounter (Signed)
Lets order an echocardiogram-shortness of breath.  This will evaluate aortic valve calcifications to see if there is any evidence of stenosis.   Candee Furbish, MD        Previous Messages    ----- Message -----  From: Joella Prince, RN  Sent: 09/13/2021  10:11 AM EDT  To: Jerline Pain, MD   Results reviewed with patient - Patient would like cardiology review and recommendation of aortic valve calcifications.  States he has noted some increased SOB and fatigue and would appreciate a follow up.  Requested we fax report to cardiology and PCP    Pt is aware of the above order and that he will be contacted to be scheduled.

## 2021-09-28 ENCOUNTER — Ambulatory Visit (HOSPITAL_COMMUNITY): Payer: Medicare Other | Attending: Cardiology

## 2021-09-28 DIAGNOSIS — R0602 Shortness of breath: Secondary | ICD-10-CM | POA: Diagnosis not present

## 2021-09-28 DIAGNOSIS — I359 Nonrheumatic aortic valve disorder, unspecified: Secondary | ICD-10-CM | POA: Diagnosis not present

## 2021-09-28 LAB — ECHOCARDIOGRAM COMPLETE
Area-P 1/2: 3.53 cm2
S' Lateral: 2.7 cm

## 2021-10-11 ENCOUNTER — Other Ambulatory Visit: Payer: Medicare Other | Admitting: *Deleted

## 2021-10-11 DIAGNOSIS — Z79899 Other long term (current) drug therapy: Secondary | ICD-10-CM

## 2021-10-11 DIAGNOSIS — E7800 Pure hypercholesterolemia, unspecified: Secondary | ICD-10-CM | POA: Diagnosis not present

## 2021-10-12 LAB — LIPID PANEL
Chol/HDL Ratio: 2.5 ratio (ref 0.0–5.0)
Cholesterol, Total: 138 mg/dL (ref 100–199)
HDL: 55 mg/dL (ref >39)
LDL Chol Calc (NIH): 61 mg/dL (ref 0–99)
Triglycerides: 122 mg/dL (ref 0–149)
VLDL Cholesterol Cal: 22 mg/dL (ref 5–40)

## 2021-10-12 LAB — ALT: ALT: 37 IU/L (ref 0–44)

## 2021-10-23 ENCOUNTER — Encounter: Payer: Self-pay | Admitting: Cardiology

## 2021-10-24 NOTE — Telephone Encounter (Signed)
Pt denies any chest pain/radiating pain. Pt advised to follow  up with dentist about this concern/issue and then PCP if needed. Patient verbalized understanding and agreeable to plan.

## 2021-11-15 DIAGNOSIS — L237 Allergic contact dermatitis due to plants, except food: Secondary | ICD-10-CM | POA: Diagnosis not present

## 2022-03-05 DIAGNOSIS — R059 Cough, unspecified: Secondary | ICD-10-CM | POA: Diagnosis not present

## 2022-03-05 DIAGNOSIS — J019 Acute sinusitis, unspecified: Secondary | ICD-10-CM | POA: Diagnosis not present

## 2022-06-05 ENCOUNTER — Other Ambulatory Visit: Payer: Self-pay | Admitting: Family Medicine

## 2022-06-05 DIAGNOSIS — I77811 Abdominal aortic ectasia: Secondary | ICD-10-CM

## 2022-06-20 DIAGNOSIS — Z125 Encounter for screening for malignant neoplasm of prostate: Secondary | ICD-10-CM | POA: Diagnosis not present

## 2022-06-20 DIAGNOSIS — R413 Other amnesia: Secondary | ICD-10-CM | POA: Diagnosis not present

## 2022-06-20 DIAGNOSIS — E7800 Pure hypercholesterolemia, unspecified: Secondary | ICD-10-CM | POA: Diagnosis not present

## 2022-06-20 DIAGNOSIS — J439 Emphysema, unspecified: Secondary | ICD-10-CM | POA: Diagnosis not present

## 2022-06-20 DIAGNOSIS — R69 Illness, unspecified: Secondary | ICD-10-CM | POA: Diagnosis not present

## 2022-06-20 DIAGNOSIS — Z79899 Other long term (current) drug therapy: Secondary | ICD-10-CM | POA: Diagnosis not present

## 2022-06-20 DIAGNOSIS — I700 Atherosclerosis of aorta: Secondary | ICD-10-CM | POA: Diagnosis not present

## 2022-06-20 DIAGNOSIS — R7309 Other abnormal glucose: Secondary | ICD-10-CM | POA: Diagnosis not present

## 2022-06-20 DIAGNOSIS — I2510 Atherosclerotic heart disease of native coronary artery without angina pectoris: Secondary | ICD-10-CM | POA: Diagnosis not present

## 2022-06-20 DIAGNOSIS — Z0001 Encounter for general adult medical examination with abnormal findings: Secondary | ICD-10-CM | POA: Diagnosis not present

## 2022-06-20 DIAGNOSIS — I10 Essential (primary) hypertension: Secondary | ICD-10-CM | POA: Diagnosis not present

## 2022-06-20 DIAGNOSIS — R3915 Urgency of urination: Secondary | ICD-10-CM | POA: Diagnosis not present

## 2022-07-04 ENCOUNTER — Ambulatory Visit
Admission: RE | Admit: 2022-07-04 | Discharge: 2022-07-04 | Disposition: A | Discharge status: Home / Self Care | Payer: Medicare HMO | Source: Ambulatory Visit | Attending: Family Medicine | Admitting: Family Medicine

## 2022-07-04 DIAGNOSIS — I77811 Abdominal aortic ectasia: Secondary | ICD-10-CM

## 2022-07-09 DIAGNOSIS — J069 Acute upper respiratory infection, unspecified: Secondary | ICD-10-CM | POA: Diagnosis not present

## 2022-07-09 DIAGNOSIS — J029 Acute pharyngitis, unspecified: Secondary | ICD-10-CM | POA: Diagnosis not present

## 2022-07-31 DIAGNOSIS — M79605 Pain in left leg: Secondary | ICD-10-CM | POA: Diagnosis not present

## 2022-07-31 DIAGNOSIS — M5432 Sciatica, left side: Secondary | ICD-10-CM | POA: Diagnosis not present

## 2022-08-07 DIAGNOSIS — M5430 Sciatica, unspecified side: Secondary | ICD-10-CM | POA: Insufficient documentation

## 2022-08-09 DIAGNOSIS — R413 Other amnesia: Secondary | ICD-10-CM | POA: Insufficient documentation

## 2022-08-13 ENCOUNTER — Encounter: Payer: Self-pay | Admitting: Neurology

## 2022-08-13 ENCOUNTER — Ambulatory Visit: Payer: Medicare HMO | Admitting: Neurology

## 2022-08-13 VITALS — BP 176/100 | HR 87 | Ht 70.0 in | Wt 203.5 lb

## 2022-08-13 DIAGNOSIS — M5416 Radiculopathy, lumbar region: Secondary | ICD-10-CM

## 2022-08-13 DIAGNOSIS — R413 Other amnesia: Secondary | ICD-10-CM | POA: Diagnosis not present

## 2022-08-13 DIAGNOSIS — G3184 Mild cognitive impairment, so stated: Secondary | ICD-10-CM

## 2022-08-13 NOTE — Patient Instructions (Addendum)
ATN profile to look for Alzheimer dementia biomarker's (Patient with strong family history)  Continue your other medications  Referral to physical therapy for lumbar radiculopathy Follow up as needed   There are well-accepted and sensible ways to reduce risk for Alzheimers disease and other degenerative brain disorders .  Exercise Daily Walk A daily 20 minute walk should be part of your routine. Disease related apathy can be a significant roadblock to exercise and the only way to overcome this is to make it a daily routine and perhaps have a reward at the end (something your loved one loves to eat or drink perhaps) or a personal trainer coming to the home can also be very useful. Most importantly, the patient is much more likely to exercise if the caregiver / spouse does it with him/her. In general a structured, repetitive schedule is best.  General Health: Any diseases which effect your body will effect your brain such as a pneumonia, urinary infection, blood clot, heart attack or stroke. Keep contact with your primary care doctor for regular follow ups.  Sleep. A good nights sleep is healthy for the brain. Seven hours is recommended. If you have insomnia or poor sleep habits we can give you some instructions. If you have sleep apnea wear your mask.  Diet: Eating a heart healthy diet is also a good idea; fish and poultry instead of red meat, nuts (mostly non-peanuts), vegetables, fruits, olive oil or canola oil (instead of butter), minimal salt (use other spices to flavor foods), whole grain rice, bread, cereal and pasta and wine in moderation.Research is now showing that the MIND diet, which is a combination of The Mediterranean diet and the DASH diet, is beneficial for cognitive processing and longevity. Information about this diet can be found in The MIND Diet, a book by Alonna Minium, MS, RDN, and online at [employer]  Finances, Power of [address] and Advance  Directives: You should consider putting legal safeguards in place with regard to financial and medical decision making. While the spouse always has power of attorney for medical and financial issues in the absence of any form, you should consider what you want in case the spouse / caregiver is no longer around or capable of making decisions.

## 2022-08-13 NOTE — Progress Notes (Signed)
[facility]  PATIENT: Evan [name] DOB: 12-09-1950  REQUESTING CLINICIAN: Koirala, Dibas, MD HISTORY FROM: Patient  REASON FOR VISIT: Memory loss/Strong family history of Dementia    HISTORICAL  CHIEF COMPLAINT:  Chief Complaint  Patient presents with   Memory Loss    Rm 12, ALONE MOCA:26 Memory loss/CRIPPLING SCIATICA PAIN (STARTED 2 WEEKS AGO)    HISTORY OF PRESENT ILLNESS:  This is a 72 year old gentleman past medical history of hypertension, hyperlipidemia, CAD, emphysema who is presenting with memory complaints. Memory problems described as trouble remembering names of friends that he has for more than 40 years.  He reports when driving he have to use the GPS even though he has been living in [place] for more than 50 years.  He denies being lost in familiar place and no recent accidents. He has to write everything down or if he will forget.  He depends on the calendar otherwise he will miss his appointments.  He also have to write notes.  He feels like his wife has been complaining about his memory issues.  Other than the memory concern, patient is independent all activities of daily living.  He reports a strong family history of Alzheimer disease including his father and his uncles, his aunties and grand parents on his father side.   TBI:   No past history of TBI Stroke:    no past history of stroke Seizures:   no past history of seizures Sleep:  no history of sleep apnea.   Mood:  patient denies anxiety and depression Family history of Dementia: Father, uncles, sister and grandfather on father side  Functional status: independent in all ADLs and IADLs Patient lives with spouse at home. Cooking: no issues Cleaning: no issues Shopping: no issues Bathing: no issues  Toileting:  no issues  Driving: has to use GPS, no recent accident  Bills: No issues   Ever left the stove on by accident?: Denies   Forget how to use items around the house?:  Denies Getting lost going to familiar places?: Denies  Forgetting loved ones names?: No  Word finding difficulty? No  Sleep: Perfect but in the last 2 weeks had sciatica which keeps him up all night   OTHER MEDICAL CONDITIONS: Hypertension, Hyperlipidemia, CAD, emphysema    REVIEW OF SYSTEMS: Full 14 system review of systems performed and negative with exception of: As noted in the HPI   ALLERGIES: Allergies  Allergen Reactions   Hydrochlorothiazide Other (See Comments)    HOME MEDICATIONS: Outpatient Medications Prior to Visit  Medication Sig Dispense Refill   TAMSULOSIN HCL PO Take 0.4 mg by mouth daily.     acetaminophen (TYLENOL) 500 MG tablet Take 1,000 mg by mouth every 6 (six) hours as needed.     aspirin EC 81 MG tablet Take 1 tablet (81 mg total) by mouth daily. Swallow whole. 90 tablet 3   losartan (COZAAR) 50 MG tablet Take 50 mg by mouth at bedtime.  0   Multiple Vitamins-Minerals (MULTIVITAMIN MEN 50+ PO) Take by mouth.     nicotine polacrilex (NICORETTE) 2 MG gum Take 2 mg by mouth as needed for smoking cessation.     Omega-3 Fatty Acids (FISH OIL) 1000 MG CAPS Take 1,000 mg by mouth daily.     rosuvastatin (CRESTOR) 40 MG tablet Take 1 tablet (40 mg total) by mouth daily. 90 tablet 3   Tdap (BOOSTRIX) 5-2.5-18.5 LF-MCG/0.5 injection Inject into the muscle. 0.5 mL 0   b complex  vitamins capsule Take 1 capsule by mouth daily.     Cyanocobalamin (VITAMIN B 12) 500 MCG TABS Take by mouth.     No facility-administered medications prior to visit.    PAST MEDICAL HISTORY: Past Medical History:  Diagnosis Date   Aortic atherosclerosis (HCC)    CAD (coronary artery disease)    Diverticulosis    Emphysema lung (HCC)    Hypercholesteremia    Hyperlipemia    Hypertension    Memory changes    Smoker    Tinnitus of both ears     PAST SURGICAL HISTORY: Past Surgical History:  Procedure Laterality Date   HAND SURGERY     multiple reconstructive surgeries     HEMORRHOID SURGERY  01/23/11   HEMORRHOID SURGERY     HERNIA REPAIR     LEG SURGERY      FAMILY HISTORY: Family History  Problem Relation Age of Onset   Alzheimer's disease Father    Alzheimer's disease Paternal Uncle    Alzheimer's disease Paternal Grandfather     SOCIAL HISTORY: Social History   Socioeconomic History   Marital status: Married    Spouse name: Not on file   Number of children: 3   Years of education: Not on file   Highest education level: Not on file  Occupational History   Not on file  Tobacco Use   Smoking status: Former    Packs/day: 0    Types: Cigarettes    Quit date: 08/2015    Years since quitting: 7.0   Smokeless tobacco: Never  Substance and Sexual Activity   Alcohol use: Yes    Alcohol/week: 3.0 standard drinks of alcohol    Types: 3 Standard drinks or equivalent per week   Drug use: No   Sexual activity: Yes  Other Topics Concern   Not on file  Social History Narrative   Not on file   Social Determinants of Health   Financial Resource Strain: Not on file  Food Insecurity: Not on file  Transportation Needs: Not on file  Physical Activity: Not on file  Stress: Not on file  Social Connections: Not on file  Intimate Partner Violence: Not on file     PHYSICAL EXAM   GENERAL EXAM/CONSTITUTIONAL: Vitals:  Vitals:   08/13/22 0900  BP: (!) 176/100  Pulse: 87  Weight: 203 lb 8 oz (92.3 kg)  Height: 5\' 10"  (1.778 m)   Body mass index is 29.2 kg/m. Wt Readings from Last 3 Encounters:  08/13/22 203 lb 8 oz (92.3 kg)  07/27/21 206 lb (93.4 kg)  07/12/21 206 lb 9.6 oz (93.7 kg)   Patient is in no distress; well developed, nourished and groomed; neck is supple  MUSCULOSKELETAL: Gait, strength, tone, movements noted in Neurologic exam below  NEUROLOGIC: MENTAL STATUS:      No data to display            08/13/2022    9:03 AM  Montreal Cognitive Assessment   Visuospatial/ Executive (0/5) 5  Naming (0/3) 3  Attention:  Read list of digits (0/2) 2  Attention: Read list of letters (0/1) 1  Attention: Serial 7 subtraction starting at 100 (0/3) 3  Language: Repeat phrase (0/2) 1  Language : Fluency (0/1) 1  Abstraction (0/2) 2  Delayed Recall (0/5) 2  Orientation (0/6) 6  Total 26  Adjusted Score (based on education) 26     CRANIAL NERVE:  2nd, 3rd, 4th, 6th- visual fields full to confrontation, extraocular muscles  intact, no nystagmus 5th - facial sensation symmetric 7th - facial strength symmetric 8th - hearing intact 9th - palate elevates symmetrically, uvula midline 11th - shoulder shrug symmetric 12th - tongue protrusion midline  MOTOR:  normal bulk and tone, full strength in the BUE, BLE  SENSORY:  normal and symmetric to light touch  COORDINATION:  finger-nose-finger, fine finger movements normal  GAIT/STATION:  normal   DIAGNOSTIC DATA (LABS, IMAGING, TESTING) - I reviewed patient records, labs, notes, testing and imaging myself where available.  Lab Results  Component Value Date   WBC 6.5 05/12/2016   HGB 15.8 05/12/2016   HCT 46.0 05/12/2016   MCV 85.0 05/12/2016   PLT 291 05/12/2016      Component Value Date/Time   NA 139 05/12/2016 0947   K 3.9 05/12/2016 0947   CL 104 05/12/2016 0947   CO2 27 05/12/2016 0947   GLUCOSE 122 (H) 05/12/2016 0947   BUN 15 05/12/2016 0947   CREATININE 0.93 05/12/2016 0947   CALCIUM 9.3 05/12/2016 0947   ALT 37 10/11/2021 0742   GFRNONAA >60 05/12/2016 0947   GFRAA >60 05/12/2016 0947   Lab Results  Component Value Date   CHOL 138 10/11/2021   HDL 55 10/11/2021   LDLCALC 61 10/11/2021   TRIG 122 10/11/2021   CHOLHDL 2.5 10/11/2021   No results found for: "HGBA1C" No results found for: "VITAMINB12" No results found for: "TSH"  HgA1C 6.1 TSH 0.92  ASSESSMENT AND PLAN  72 y.o. year old male with history of hypertension, hyperlipidemia, CAD, emphysema who is presenting with memory problem described as being forgetful,  needing reminders, forgetting close friends names.On exam today, he has a normal MoCa score and he is independent in all activity of daily living.  I informed patient that he likely has normal cognition. Due to his strong family history of dementia, father, uncle, aunts, and grand father,  I will look for Alzheimer disease biomarker's with ATN profile.  I will contact the patient to go over the results.  If normal, no further workup indicated at the moment but if any abnormality I will bring the patient to discuss further testing if needed. Due to his lumbar radiculopathy pain that has been going on for the past 2 weeks, I will send him to physical therapy.  Continue to follow with PCP return as needed.   1. Memory loss   2. Lumbar radiculopathy      Patient Instructions  ATN profile to look for Alzheimer dementia biomarker's (Patient with strong family history)  Continue your other medications  Referral to physical therapy for lumbar radiculopathy Follow up as needed   There are well-accepted and sensible ways to reduce risk for Alzheimers disease and other degenerative brain disorders .  Exercise Daily Walk A daily 20 minute walk should be part of your routine. Disease related apathy can be a significant roadblock to exercise and the only way to overcome this is to make it a daily routine and perhaps have a reward at the end (something your loved one loves to eat or drink perhaps) or a personal trainer coming to the home can also be very useful. Most importantly, the patient is much more likely to exercise if the caregiver / spouse does it with him/her. In general a structured, repetitive schedule is best.  General Health: Any diseases which effect your body will effect your brain such as a pneumonia, urinary infection, blood clot, heart attack or stroke. Keep contact with your primary  care doctor for regular follow ups.  Sleep. A good nights sleep is healthy for the brain. Seven hours is  recommended. If you have insomnia or poor sleep habits we can give you some instructions. If you have sleep apnea wear your mask.  Diet: Eating a heart healthy diet is also a good idea; fish and poultry instead of red meat, nuts (mostly non-peanuts), vegetables, fruits, olive oil or canola oil (instead of butter), minimal salt (use other spices to flavor foods), whole grain rice, bread, cereal and pasta and wine in moderation.Research is now showing that the MIND diet, which is a combination of The Mediterranean diet and the DASH diet, is beneficial for cognitive processing and longevity. Information about this diet can be found in The MIND Diet, a book by Alonna Minium, MS, RDN, and online at [employer]  Finances, Power of [address] and Advance Directives: You should consider putting legal safeguards in place with regard to financial and medical decision making. While the spouse always has power of attorney for medical and financial issues in the absence of any form, you should consider what you want in case the spouse / caregiver is no longer around or capable of making decisions.     Orders Placed This Encounter  Procedures   ATN PROFILE   Ambulatory referral to Physical Therapy    No orders of the defined types were placed in this encounter.   Return if symptoms worsen or fail to improve.  I have spent a total of 65 minutes dedicated to this patient today, preparing to see patient, performing a medically appropriate examination and evaluation, ordering tests and/or medications and procedures, and counseling and educating the patient/family/caregiver; independently interpreting result and communicating results to the family/patient/caregiver; and documenting clinical information in the electronic medical record.   Windell Norfolk, MD 08/13/2022, 11:09 AM  [facility] [address] [place], [place] [address] [phone]

## 2022-08-14 ENCOUNTER — Ambulatory Visit: Payer: Medicare HMO | Admitting: Physical Therapy

## 2022-08-14 DIAGNOSIS — M5459 Other low back pain: Secondary | ICD-10-CM

## 2022-08-14 NOTE — Therapy (Signed)
[facility] [address] [place], [place], [address] Phone: [phone]   Fax:  [phone]  Patient Details  Name: Evan [name] MRN: [id number] Date of Birth: Dec 25, 1950 Referring Provider:  Darrow Bussing, MD  Encounter Date: 08/14/2022   PT ARRIVE/NO CHARGE - FOR INITIAL EVALUATION  Pt reports he began having back pain 2 1/2 weeks ago; states his PCP referred him to [facility] but he has not been able to get an appt yet.  Pt reports his left knee is giving out at times - he is wearing a knee sleeve/brace for more stability to prevent falling.  Pt saw Dr. Teresa Coombs yesterday for memory loss and mentioned to him that he had been having Lt sided low back pain for past 2 weeks but no definitive work up has been done as of this time.  Pt would benefit from referral to orthopedic for definite diagnosis - concern for nerve impingement or disc protrusion at this time.  Pt stated he would contact [facility] for an appt.  Pt was instructed to call this facility back in the future if PT was ordered after diagnosis obtained.  Kary Kos, PT 08/14/2022, 10:26 AM  [facility] [address] [place], [place], [address] Phone: [phone]   Fax:  [phone]

## 2022-08-16 DIAGNOSIS — M545 Low back pain, unspecified: Secondary | ICD-10-CM | POA: Diagnosis not present

## 2022-08-16 DIAGNOSIS — M79605 Pain in left leg: Secondary | ICD-10-CM | POA: Diagnosis not present

## 2022-08-16 DIAGNOSIS — M47816 Spondylosis without myelopathy or radiculopathy, lumbar region: Secondary | ICD-10-CM | POA: Diagnosis not present

## 2022-08-16 LAB — ATN PROFILE
A -- Beta-amyloid 42/40 Ratio: 0.094 — ABNORMAL LOW (ref >0.102)
Beta-amyloid 40: 175.29 pg/mL
Beta-amyloid 42: 16.49 pg/mL
N -- NfL, Plasma: 12.7 pg/mL — ABNORMAL HIGH (ref 0.00–7.64)
T -- p-tau181: 0.88 pg/mL (ref 0.00–0.97)

## 2022-08-17 ENCOUNTER — Ambulatory Visit: Payer: Medicare HMO

## 2022-08-21 ENCOUNTER — Encounter: Payer: Self-pay | Admitting: Rehabilitative and Restorative Service Providers"

## 2022-08-21 ENCOUNTER — Ambulatory Visit: Payer: Medicare HMO | Attending: Sports Medicine | Admitting: Rehabilitative and Restorative Service Providers"

## 2022-08-21 DIAGNOSIS — M6281 Muscle weakness (generalized): Secondary | ICD-10-CM | POA: Diagnosis not present

## 2022-08-21 DIAGNOSIS — R2689 Other abnormalities of gait and mobility: Secondary | ICD-10-CM | POA: Diagnosis not present

## 2022-08-21 DIAGNOSIS — R252 Cramp and spasm: Secondary | ICD-10-CM

## 2022-08-21 DIAGNOSIS — M5459 Other low back pain: Secondary | ICD-10-CM | POA: Diagnosis not present

## 2022-08-21 NOTE — Addendum Note (Signed)
Addended byWindell Norfolk on: 08/21/2022 04:55 PM   Modules accepted: Orders

## 2022-08-21 NOTE — Patient Instructions (Signed)
     [facility] Welcome to [facility]! Here you will find all the information you will need regarding your pool therapy. If you have further questions at any time, please call our office at [phone]. After completing your initial evaluation in the [facility], you may be eligible to complete a portion of your therapy in the pool. A typical week of therapy will consist of 1-2 typical physical therapy visits at our [facility] location and an additional session of therapy in the pool located at the [facility] at [address]. [address], [place] [address]. The phone number at the pool site is [phone]. Please call this number if you are running late or need to cancel your appointment.  Aquatic therapy will be offered on Wednesday mornings and Friday afternoons. Each session will last approximately 45 minutes. All scheduling and payments for aquatic therapy sessions, including cancelations, will be done through our [facility] location.  To be eligible for aquatic therapy, these criteria must be met: You must be able to independently change in the locker room and get to the pool deck. A caregiver can come with you to help if needed. There are benches for a caregiver to sit on next to the pool. No one with an open wound is permitted in the pool.  Handicap parking is available in the front and there is a drop off option for even closer accessibility. Please arrive 15 minutes prior to your appointment to prepare for your pool session. You must sign in at the front desk upon your arrival. Please be sure to attend to any toileting needs prior to entering the pool. Locker rooms for changing are available.  There is direct access to the pool deck from the locker room. You can lock your belongings in a locker or bring them with you poolside. Your therapist will greet you on the pool deck. There may be other swimmers in the pool at the  same time but your session is one-on-one with the therapist.   

## 2022-08-21 NOTE — Therapy (Signed)
OUTPATIENT PHYSICAL THERAPY THORACOLUMBAR EVALUATION   Patient Name: Evan [name] MRN: [id number] DOB:11/09/1950, 72 y.o., male Today's Date: 08/21/2022  END OF SESSION:  PT End of Session - 08/21/22 1110     Visit Number 1    Date for PT Re-Evaluation 10/12/22    Authorization Type [employer]    Progress Note Due on Visit 10    PT Start Time 1100    PT Stop Time 1140    PT Time Calculation (min) 40 min    Activity Tolerance Patient tolerated treatment well    Behavior During Therapy WFL for tasks assessed/performed             Past Medical History:  Diagnosis Date   Aortic atherosclerosis (HCC)    CAD (coronary artery disease)    Diverticulosis    Emphysema lung (HCC)    Hypercholesteremia    Hyperlipemia    Hypertension    Memory changes    Smoker    Tinnitus of both ears    Past Surgical History:  Procedure Laterality Date   HAND SURGERY     multiple reconstructive surgeries    HEMORRHOID SURGERY  01/23/11   HEMORRHOID SURGERY     HERNIA REPAIR     LEG SURGERY     Patient Active Problem List   Diagnosis Date Noted   Memory loss 08/09/2022   Sciatica 08/07/2022   Coronary artery disease involving native coronary artery of native heart without angina pectoris 07/12/2021   Former smoker 06/08/2016   Atypical chest pain 06/08/2016   Pure hypercholesterolemia 06/08/2016   Family history of early CAD 06/08/2016   RBBB 06/08/2016    PCP: Darrow Bussing, MD  REFERRING PROVIDER: Delfin Gant, MD  REFERRING DIAG: M54.50 (ICD-10-CM) - Low back pain, unspecified  Rationale for Evaluation and Treatment: Rehabilitation  THERAPY DIAG:  Other low back pain - Plan: PT plan of care cert/re-cert  Cramp and spasm - Plan: PT plan of care cert/re-cert  Muscle weakness (generalized) - Plan: PT plan of care cert/re-cert  Other abnormalities of gait and mobility - Plan: PT plan of care cert/re-cert  ONSET DATE: 07/14/2022  SUBJECTIVE:                                                                                                                                                                                            SUBJECTIVE STATEMENT: Patient reports that in early Evan, he had played golf and spent some time helping build some homes for [employer].  Patient has been having pain since that time.  Was able to see Dr Penni Bombard last  week and he was prescribed some new medication and orders for PT.  Patient states that he has been having decreased pain since that time.  States that his left knee has been giving out on him.  Patient states that he has been wearing a knee compression brace since that time.  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 Yes: NPRS scale: 3-4/10 Pain location: low back, radiating down left LE Pain description: sharp at times, otherwise aching Aggravating factors: driving for prolonged periods Relieving factors: medication  PRECAUTIONS: Fall  WEIGHT BEARING RESTRICTIONS: No  FALLS:  Has patient fallen in last 6 months? Yes. Number of falls 2 falls in past month (reports left knee went out on him).  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  NEXT MD VISIT: as needed with Dr Penni Bombard  OBJECTIVE:   DIAGNOSTIC FINDINGS:  Lumbar Radiograph with Dr Penni Bombard unremarkable per patient  PATIENT SURVEYS:  Eval:  FOTO 45% (projected 66% by visit 10)  SCREENING FOR RED FLAGS: Bowel or bladder incontinence: No Spinal tumors: No Cauda equina syndrome:  No Compression fracture: No Abdominal aneurysm: No  COGNITION: Overall cognitive status: Within functional limits for tasks assessed.  Does state that he has noted some memory changes, so he is working with a Insurance account manager to further assess.   SENSATION: Denies numbness or tingling, but does have some radicular pain down LLE.  MUSCLE LENGTH: Hamstrings: tightness noted  POSTURE: rounded shoulders and forward head  PALPATION: Some spasms noted in lumbar multifidi  LUMBAR ROM:   WFL  LOWER EXTREMITY ROM:     WFL  LOWER EXTREMITY MMT:    08/21/2022: Right leg strength is WFL Left hip strength is 4/5, left quad strength is 4/5, left hamstring strength is 4+/5  LUMBAR SPECIAL TESTS:  Slump test: slight positive on left side  FUNCTIONAL TESTS:  Eval: 5 times sit to stand: 13.90 sec with UE sec with decreased weight bearing through LLE  GAIT: Distance walked: >50 ft Assistive device utilized: None Level of assistance: Complete Independence Comments: Patient reports that he has been wearing the left knee brace secondary to him feeling that at times his left leg may give out on him.  TODAY'S TREATMENT:                                                                                                                               DATE: 08/21/2022  Reviewed HEP (see below) Education on aquatics   PATIENT EDUCATION:  Education details: Issued HEP Person educated: Patient Education method: Explanation,  Demonstration, and Handouts Education comprehension: verbalized understanding and returned demonstration  HOME EXERCISE PROGRAM: Access Code: [email or web address] URL: [email or web address] Date: 08/21/2022 Prepared by: Clydie Braun Kersti Scavone  Exercises - Supine Pelvic Tilt  - 1 x daily - 7 x weekly - 2 sets - 10 reps - Supine Single Knee to Chest Stretch  - 1 x daily - 7 x weekly - 1 sets - 2 reps - 20 sec hold - Supine Lower Trunk Rotation  - 1 x daily - 7 x weekly - 1 sets -  10 reps - Supine Bridge  - 1 x daily - 7 x weekly - 2 sets - 10 reps - Supine Hamstring Stretch with Strap  - 1 x daily - 7 x weekly - 1 sets - 2 reps - 20 sec hold  ASSESSMENT:  CLINICAL IMPRESSION: Patient is a 72 y.o. male who was seen today for physical therapy evaluation and treatment for low back pain. 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  Patient states that in early Evan, after golf and helping build a house, he was noticing increased pain that did not decrease.  Patient followed up with Dr Penni Bombard and was referred for PT and prescribed medication to assist with pain management.  Patient is reporting that he is having some less pain since being on the medication and most of the prescription medication will be stopping today.  Patient has a membership to [employer] and is interested in aquatics PT to teach him some exercises that he can do independently at the pool.  Patient presents with left LE weakness, increased pain, difficulty walking with 2 recent falls and numerous near falls, and difficulty performing typical daily activities.  Patient would benefit from skilled PT to address his functional impairments to allow him to return to activities without increased pain.  OBJECTIVE IMPAIRMENTS: decreased balance, difficulty walking, decreased strength, increased muscle spasms, impaired flexibility, postural dysfunction, and pain.   ACTIVITY LIMITATIONS: lifting, bending, and squatting  PARTICIPATION LIMITATIONS: driving and community activity  PERSONAL FACTORS: Time since onset of injury/illness/exacerbation are also affecting patient's functional outcome.   REHAB POTENTIAL: Good  CLINICAL DECISION MAKING: Stable/uncomplicated  EVALUATION COMPLEXITY: Low   GOALS: Goals reviewed with patient? Yes  SHORT TERM GOALS: Target date: 09/07/2022  Patient will be independent with initial HEP. Baseline: Goal status:  INITIAL  2.  Patient will report at least a 40% improvement in symptoms during daily activities. Baseline:  Goal status: INITIAL   LONG TERM GOALS: Target date: 10/12/2022  Patient will be independent with advanced HEP. Baseline:  Goal status: INITIAL  2.  Patient will increase FOTO to at least 66% to demonstrate improvements in functional mobility. Baseline: 45% Goal status: INITIAL  3.  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 Baseline:  Goal status: INITIAL  4.  Patient will report ability to return to golf with no increase in pain. Baseline:  Goal status: INITIAL  5.  Patient will improve functional balance and not have any instances of losing balance or near loss of balance in 2 weeks leading up to discharge. Baseline:  Goal status: INITIAL   PLAN:  PT FREQUENCY: 2x/week  PT DURATION: 8 weeks  PLANNED INTERVENTIONS: Therapeutic exercises, Therapeutic activity, Neuromuscular re-education, Balance training, Gait training, Patient/Family education, Self Care, Joint mobilization, Joint manipulation, Stair training, Aquatic Therapy, Dry Needling, Electrical stimulation, Spinal  manipulation, Spinal mobilization, Cryotherapy, Moist heat, Taping, Traction, Ultrasound, Ionotophoresis 4mg /ml Dexamethasone, Manual therapy, and Re-evaluation.  PLAN FOR NEXT SESSION: Assess and progress HEP as indicated, core stability, flexibility, strengthening   Reather Laurence, PT 08/21/2022, 12:50 PM   [facility] [address] [place], [place] [address] Phone # [phone] Fax [phone]

## 2022-08-24 ENCOUNTER — Ambulatory Visit (HOSPITAL_BASED_OUTPATIENT_CLINIC_OR_DEPARTMENT_OTHER): Payer: Medicare HMO | Attending: Neurology | Admitting: Physical Therapy

## 2022-08-24 DIAGNOSIS — M5459 Other low back pain: Secondary | ICD-10-CM | POA: Diagnosis not present

## 2022-08-24 DIAGNOSIS — M6281 Muscle weakness (generalized): Secondary | ICD-10-CM | POA: Diagnosis not present

## 2022-08-24 DIAGNOSIS — R252 Cramp and spasm: Secondary | ICD-10-CM | POA: Insufficient documentation

## 2022-08-24 DIAGNOSIS — R2689 Other abnormalities of gait and mobility: Secondary | ICD-10-CM | POA: Diagnosis not present

## 2022-08-24 NOTE — Patient Instructions (Signed)

## 2022-08-24 NOTE — Therapy (Signed)
OUTPATIENT PHYSICAL THERAPY THORACOLUMBAR TREATMENT   Patient Name: Evan [name] MRN: [id number] DOB:09/21/1950, 72 y.o., male Today's Date: 08/24/2022  END OF SESSION:  PT End of Session - 08/24/22 1515     Visit Number 2    Date for PT Re-Evaluation 10/12/22    Authorization Type [employer]    Progress Note Due on Visit 10    PT Start Time 1515    PT Stop Time 1555    PT Time Calculation (min) 40 min    Behavior During Therapy WFL for tasks assessed/performed             Past Medical History:  Diagnosis Date   Aortic atherosclerosis (HCC)    CAD (coronary artery disease)    Diverticulosis    Emphysema lung (HCC)    Hypercholesteremia    Hyperlipemia    Hypertension    Memory changes    Smoker    Tinnitus of both ears    Past Surgical History:  Procedure Laterality Date   HAND SURGERY     multiple reconstructive surgeries    HEMORRHOID SURGERY  01/23/11   HEMORRHOID SURGERY     HERNIA REPAIR     LEG SURGERY     Patient Active Problem List   Diagnosis Date Noted   Memory loss 08/09/2022   Sciatica 08/07/2022   Coronary artery disease involving native coronary artery of native heart without angina pectoris 07/12/2021   Former smoker 06/08/2016   Atypical chest pain 06/08/2016   Pure hypercholesterolemia 06/08/2016   Family history of early CAD 06/08/2016   RBBB 06/08/2016    PCP: Darrow Bussing, MD  REFERRING PROVIDER: Delfin Gant, MD  REFERRING DIAG: M54.50 (ICD-10-CM) - Low back pain, unspecified  Rationale for Evaluation and Treatment: Rehabilitation  THERAPY DIAG:  Other low back pain  Cramp and spasm  Other abnormalities of gait and mobility  Muscle weakness (generalized)  ONSET DATE: 07/14/2022  SUBJECTIVE:                                                                                                                                                                                           SUBJECTIVE STATEMENT: Pt reports he  went walking at [facility] pool a few days ago, for 30 min, and it felt ok.    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 Yes: NPRS scale: 1/10 Pain location: low back, radiating down left LE Pain description: sharp at times, otherwise aching Aggravating factors: driving for prolonged periods Relieving factors: medication  PRECAUTIONS: Fall  WEIGHT BEARING RESTRICTIONS: No  FALLS:  Has patient fallen in last 6 months? Yes. Number of falls 2 falls in past month (reports left knee went out on him).  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  NEXT MD VISIT: as needed with Dr Penni Bombard  OBJECTIVE:   DIAGNOSTIC FINDINGS:  Lumbar Radiograph with Dr Penni Bombard unremarkable per patient  PATIENT SURVEYS:  Eval:  FOTO 45% (projected 66% by visit 10)  SCREENING FOR RED FLAGS: Bowel or bladder incontinence: No Spinal tumors: No Cauda equina syndrome: No Compression fracture: No Abdominal aneurysm: No  COGNITION: Overall cognitive status: Within functional limits for tasks assessed.  Does state that he has noted some memory changes, so he is working with a Insurance account manager to further assess.   SENSATION: Denies numbness or tingling, but does have some radicular pain down LLE.  MUSCLE LENGTH: Hamstrings: tightness noted  POSTURE: rounded shoulders and forward head  PALPATION: Some spasms noted in lumbar multifidi  LUMBAR ROM:   WFL  LOWER EXTREMITY ROM:     WFL  LOWER EXTREMITY MMT:    08/21/2022: Right leg strength is  WFL Left hip strength is 4/5, left quad strength is 4/5, left hamstring strength is 4+/5  LUMBAR SPECIAL TESTS:  Slump test: slight positive on left side  FUNCTIONAL TESTS:  Eval: 5 times sit to stand: 13.90 sec with UE sec with decreased weight bearing through LLE  GAIT: Distance walked: >50 ft Assistive device utilized: None Level of assistance: Complete Independence Comments: Patient reports that he has been wearing the left knee brace secondary to him feeling that at times his left leg may give out on him.  TODAY'S TREATMENT:                                                                                                                               DATE: 08/22/2022  Pt seen for aquatic therapy today.  Treatment took place in water 3.5-4.75 ft in depth at the [facility] pool. Temp of water was 91.  Pt entered/exited the pool via stairs independently with bilat rail. * unsupported:  walking forward/ backward with cues for vertical trunk * side stepping without/ with arm abdct/ addct (ankle floats increased Rt shoulder pain- limited to 1 lap) - x 3 total laps * high knee marching forward/ backward  * UE on yellow noodle: 3 way LE kick 2 x 5 * bilat hands in single med bell - golf swing with feet swivel * staggered stance with reciprocal arm swing with med bells (pink) * TrA set with short noodle pull down (Lt thumb in hole) -> hands flat on  kick board  * staggered stance with kick board row * STS with feet on blue step, cues for slow return to bench, hip hinge, and core activated * piriformis stretch when sitting on bench in water L/R/L   Pt requires the buoyancy and hydrostatic pressure of water for support, and to offload joints by unweighting joint load by at least 50 % in navel deep water and by at least 75-80% in chest to neck deep water.  Viscosity of the water is needed for resistance of strengthening. Water current perturbations provides challenge to standing  balance requiring increased core activation.     PATIENT EDUCATION:  Education details: Psychologist, forensic out Person educated: Patient Education method: Explanation, Demonstration, and Handouts Education comprehension: verbalized understanding and returned demonstration  HOME EXERCISE PROGRAM: Access Code: [email or web address] URL: [email or web address] Date: 08/21/2022 Prepared by: Clydie Braun Menke  Exercises - Supine Pelvic Tilt  - 1 x daily - 7 x weekly - 2 sets - 10 reps - Supine Single Knee to Chest Stretch  - 1 x daily - 7 x weekly - 1 sets - 2 reps - 20 sec hold - Supine Lower Trunk Rotation  - 1 x daily - 7 x weekly - 1 sets - 10 reps - Supine Bridge  - 1 x daily - 7 x weekly - 2 sets - 10 reps - Supine Hamstring Stretch with Strap  - 1 x daily - 7 x weekly - 1 sets - 2 reps - 20 sec hold  ASSESSMENT:  CLINICAL IMPRESSION: Pt arrived with lower pain level in back/hip.  He reported pain in his Rt shoulder with resistance in motions, but no increase in LLE symptoms.  He is given cues for posture and technique of exercise.  Pt is encouraged not to overdo it in the days to come as to not aggravate his symptoms.  Good tolerance for all exercises.  Goals are ongoing. Patient would benefit from skilled PT to address his functional impairments to allow him to return to activities without increased pain.  FROM EVAL:  Patient is a 72 y.o. male who was seen today for physical therapy evaluation and treatment for low back pain. 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  Patient states that in early April, after golf and helping build a house, he was noticing increased pain that did not decrease.  Patient followed up with Dr Penni Bombard and was referred for PT and prescribed medication to assist with pain management.  Patient is reporting that he is having some less pain since being on the medication and most of the  prescription medication will be stopping today.  Patient has a membership to [employer] and is interested in aquatics PT to teach him some exercises that he can do independently at the pool.  Patient presents with left LE weakness, increased pain, difficulty walking with 2 recent falls and numerous near falls, and difficulty performing typical daily activities.   OBJECTIVE IMPAIRMENTS: decreased balance, difficulty walking, decreased strength, increased muscle spasms, impaired flexibility, postural dysfunction, and pain.   ACTIVITY LIMITATIONS: lifting, bending, and squatting  PARTICIPATION LIMITATIONS: driving and community activity  PERSONAL FACTORS: Time since onset of injury/illness/exacerbation are also affecting patient's functional outcome.   REHAB POTENTIAL: Good  CLINICAL DECISION MAKING: Stable/uncomplicated  EVALUATION COMPLEXITY: Low   GOALS: Goals reviewed with patient? Yes  SHORT TERM GOALS: Target date: 09/07/2022  Patient will be independent with initial HEP.  Baseline: Goal status: INITIAL  2.  Patient will report at least a 40% improvement in symptoms during daily activities. Baseline:  Goal status: INITIAL   LONG TERM GOALS: Target date: 10/12/2022  Patient will be independent with advanced HEP. Baseline:  Goal status: INITIAL  2.  Patient will increase FOTO to at least 66% to demonstrate improvements in functional mobility. Baseline: 45% Goal status: INITIAL  3.  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 Baseline:  Goal status: INITIAL  4.  Patient will report ability to return to golf with no increase in pain. Baseline:  Goal status: INITIAL  5.  Patient will improve functional balance and not have any instances of losing balance or near loss of balance in 2 weeks leading up to discharge. Baseline:  Goal status: INITIAL   PLAN:  PT FREQUENCY: 2x/week  PT  DURATION: 8 weeks  PLANNED INTERVENTIONS: Therapeutic exercises, Therapeutic activity, Neuromuscular re-education, Balance training, Gait training, Patient/Family education, Self Care, Joint mobilization, Joint manipulation, Stair training, Aquatic Therapy, Dry Needling, Electrical stimulation, Spinal manipulation, Spinal mobilization, Cryotherapy, Moist heat, Taping, Traction, Ultrasound, Ionotophoresis 4mg /ml Dexamethasone, Manual therapy, and Re-evaluation.  PLAN FOR NEXT SESSION: Assess and progress HEP as indicated, core stability, flexibility, strengthening  Mayer Camel, PTA 08/24/22 4:59 PM [facility] [address] [place], [place], [address] Phone: [phone]   Fax:  [phone]

## 2022-08-25 ENCOUNTER — Other Ambulatory Visit: Payer: Self-pay | Admitting: Acute Care

## 2022-08-25 DIAGNOSIS — Z87891 Personal history of nicotine dependence: Secondary | ICD-10-CM

## 2022-08-25 DIAGNOSIS — Z122 Encounter for screening for malignant neoplasm of respiratory organs: Secondary | ICD-10-CM

## 2022-08-28 ENCOUNTER — Encounter: Payer: Self-pay | Admitting: Rehabilitative and Restorative Service Providers"

## 2022-08-28 ENCOUNTER — Telehealth: Payer: Self-pay | Admitting: Neurology

## 2022-08-28 ENCOUNTER — Ambulatory Visit: Payer: Medicare HMO | Admitting: Rehabilitative and Restorative Service Providers"

## 2022-08-28 ENCOUNTER — Ambulatory Visit (HOSPITAL_BASED_OUTPATIENT_CLINIC_OR_DEPARTMENT_OTHER): Payer: Medicare HMO | Admitting: Physical Therapy

## 2022-08-28 DIAGNOSIS — R2689 Other abnormalities of gait and mobility: Secondary | ICD-10-CM | POA: Diagnosis not present

## 2022-08-28 DIAGNOSIS — M5459 Other low back pain: Secondary | ICD-10-CM

## 2022-08-28 DIAGNOSIS — R252 Cramp and spasm: Secondary | ICD-10-CM | POA: Diagnosis not present

## 2022-08-28 DIAGNOSIS — M6281 Muscle weakness (generalized): Secondary | ICD-10-CM | POA: Diagnosis not present

## 2022-08-28 NOTE — Therapy (Signed)
OUTPATIENT PHYSICAL THERAPY TREATMENT NOTE   Patient Name: Evan [name] MRN: [id number] DOB:November 04, 1950, 72 y.o., male Today's Date: 08/28/2022  END OF SESSION:  PT End of Session - 08/28/22 0806     Visit Number 3    Date for PT Re-Evaluation 10/12/22    Authorization Type [employer]    Progress Note Due on Visit 10    PT Start Time 0801    PT Stop Time 0840    PT Time Calculation (min) 39 min    Activity Tolerance Patient tolerated treatment well    Behavior During Therapy WFL for tasks assessed/performed             Past Medical History:  Diagnosis Date   Aortic atherosclerosis (HCC)    CAD (coronary artery disease)    Diverticulosis    Emphysema lung (HCC)    Hypercholesteremia    Hyperlipemia    Hypertension    Memory changes    Smoker    Tinnitus of both ears    Past Surgical History:  Procedure Laterality Date   HAND SURGERY     multiple reconstructive surgeries    HEMORRHOID SURGERY  01/23/11   HEMORRHOID SURGERY     HERNIA REPAIR     LEG SURGERY     Patient Active Problem List   Diagnosis Date Noted   Memory loss 08/09/2022   Sciatica 08/07/2022   Coronary artery disease involving native coronary artery of native heart without angina pectoris 07/12/2021   Former smoker 06/08/2016   Atypical chest pain 06/08/2016   Pure hypercholesterolemia 06/08/2016   Family history of early CAD 06/08/2016   RBBB 06/08/2016    PCP: Darrow Bussing, MD  REFERRING PROVIDER: Delfin Gant, MD  REFERRING DIAG: M54.50 (ICD-10-CM) - Low back pain, unspecified  Rationale for Evaluation and Treatment: Rehabilitation  THERAPY DIAG:  Other low back pain  Cramp and spasm  Other abnormalities of gait and mobility  Muscle weakness (generalized)  ONSET DATE: 07/14/2022  SUBJECTIVE:                                                                                                                                                                                            SUBJECTIVE STATEMENT: Pt reports that he is trying to wean down to twice a day on his Gabapentin due to mental clarity, but he is having some increased pain today.  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 Yes: NPRS scale: currently 2-3/10 Pain location: low back, radiating down left LE Pain description:  sore pain Aggravating factors: driving for prolonged periods Relieving factors: medication  PRECAUTIONS: Fall  WEIGHT BEARING RESTRICTIONS: No  FALLS:  Has patient fallen in last 6 months? Yes. Number of falls 2 falls in past month (reports left knee went out on him).  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  NEXT MD VISIT: as needed with Dr Penni Bombard  OBJECTIVE:   DIAGNOSTIC FINDINGS:  Lumbar Radiograph with Dr Penni Bombard unremarkable per patient  PATIENT SURVEYS:  Eval:  FOTO 45% (projected 66% by visit 10)  SCREENING FOR RED FLAGS: Bowel or bladder incontinence: No Spinal tumors: No Cauda equina syndrome: No Compression fracture: No Abdominal aneurysm: No  COGNITION: Overall cognitive status: Within functional limits for tasks assessed.  Does state that he has noted some memory changes, so he is working with a Insurance account manager to further assess.   SENSATION: Denies numbness or tingling, but does have some radicular pain down LLE.  MUSCLE LENGTH: Hamstrings: tightness noted  POSTURE: rounded shoulders and forward head  PALPATION: Some spasms noted in lumbar multifidi  LUMBAR ROM:   WFL  LOWER  EXTREMITY ROM:     WFL  LOWER EXTREMITY MMT:    08/21/2022: Right leg strength is WFL Left hip strength is 4/5, left quad strength is 4/5, left hamstring strength is 4+/5  LUMBAR SPECIAL TESTS:  Slump test: slight positive on left side  FUNCTIONAL TESTS:  Eval: 5 times sit to stand: 13.90 sec with UE sec with decreased weight bearing through LLE  GAIT: Distance walked: >50 ft Assistive device utilized: None Level of assistance: Complete Independence Comments: Patient reports that he has been wearing the left knee brace secondary to him feeling that at times his left leg may give out on him.  TODAY'S TREATMENT:                                                                                                                               DATE: 08/28/2022  Recumbent bike level 1.0 x6 min with PT present to discuss status Supine hamstring stretch with strap 2x20 sec bilat Supine posterior pelvic tilt 2x10 (with cuing for improved technique) Supine lower trunk rotation x10 bilat Supine single knee to chest 2x20 sec bilat Supine bridge 2x10 Supine posterior pelvic tilt with marching 2x10 bilat Prone hip extension 2x10 bilat Side-lying clamshell x15 bilat   DATE: 08/22/2022  Pt seen for aquatic therapy today.  Treatment took place in water 3.5-4.75 ft in depth at the [facility] pool. Temp of water was 91.  Pt entered/exited the pool via stairs independently with bilat rail. * unsupported:  walking forward/ backward with cues for  vertical trunk * side stepping without/ with arm abdct/ addct (ankle floats increased Rt shoulder pain- limited to 1 lap) - x 3 total laps * high knee marching forward/ backward  * UE on yellow noodle: 3 way LE kick 2 x 5 * bilat hands in single med bell - golf swing with feet swivel * staggered stance with reciprocal arm swing with med bells (pink) * TrA set with short noodle pull down (Lt thumb in hole) -> hands flat on kick board  * staggered  stance with kick board row * STS with feet on blue step, cues for slow return to bench, hip hinge, and core activated * piriformis stretch when sitting on bench in water L/R/L   Pt requires the buoyancy and hydrostatic pressure of water for support, and to offload joints by unweighting joint load by at least 50 % in navel deep water and by at least 75-80% in chest to neck deep water.  Viscosity of the water is needed for resistance of strengthening. Water current perturbations provides challenge to standing balance requiring increased core activation.     PATIENT EDUCATION:  Education details: Psychologist, forensic out Person educated: Patient Education method: Explanation, Demonstration, and Handouts Education comprehension: verbalized understanding and returned demonstration  HOME EXERCISE PROGRAM: Access Code: [email or web address] URL: [email or web address] Date: 08/28/2022 Prepared by: Clydie Braun Halvor Behrend  Exercises - Supine Pelvic Tilt  - 1 x daily - 7 x weekly - 2 sets - 10 reps - Supine Single Knee to Chest Stretch  - 1 x daily - 7 x weekly - 1 sets - 2 reps - 20 sec hold - Supine Lower Trunk Rotation  - 1 x daily - 7 x weekly - 1 sets - 10 reps - Supine Bridge  - 1 x daily - 7 x weekly - 2 sets - 10 reps - Supine Hamstring Stretch with Strap  - 1 x daily - 7 x weekly - 1 sets - 2 reps - 20 sec hold - Supine Piriformis Stretch with Foot on Ground  - 1 x daily - 7 x weekly - 1 sets - 2 reps - 20 sec hold - Supine March with Posterior Pelvic Tilt  - 1 x daily - 7 x weekly - 2 sets - 10 reps - Clamshell  - 1 x daily - 7 x weekly - 1-2 sets - 10 reps - Prone Knee Flexion  - 1 x daily - 7 x weekly - 1 sets - 10 reps  ASSESSMENT:  CLINICAL IMPRESSION: Mr Delponte arrives to skilled PT reporting that his pool session went well.  Patient able to progress with HEP during session with minimal cuing for improved technique.  Patient able to progress with increased exercises and  strengthening and updated HEP accordingly.  Patient has been able to add more aquatic sessions and is hoping to do one pool and one land session.  Patient to progress towards goal related activities.   OBJECTIVE IMPAIRMENTS: decreased balance, difficulty walking, decreased strength, increased muscle spasms, impaired flexibility, postural dysfunction, and pain.   ACTIVITY LIMITATIONS: lifting, bending, and squatting  PARTICIPATION LIMITATIONS: driving and community activity  PERSONAL FACTORS: Time since onset of injury/illness/exacerbation are also affecting patient's functional outcome.   REHAB POTENTIAL: Good  CLINICAL DECISION MAKING: Stable/uncomplicated  EVALUATION COMPLEXITY: Low   GOALS: Goals reviewed with patient? Yes  SHORT TERM GOALS: Target date: 09/07/2022  Patient will be independent with initial HEP. Baseline: Goal status: IN PROGRESS  2.  Patient  will report at least a 40% improvement in symptoms during daily activities. Baseline:  Goal status: IN PROGRESS   LONG TERM GOALS: Target date: 10/12/2022  Patient will be independent with advanced HEP. Baseline:  Goal status: INITIAL  2.  Patient will increase FOTO to at least 66% to demonstrate improvements in functional mobility. Baseline: 45% Goal status: INITIAL  3.  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 Baseline:  Goal status: INITIAL  4.  Patient will report ability to return to golf with no increase in pain. Baseline:  Goal status: INITIAL  5.  Patient will improve functional balance and not have any instances of losing balance or near loss of balance in 2 weeks leading up to discharge. Baseline:  Goal status: INITIAL   PLAN:  PT FREQUENCY: 2x/week  PT DURATION: 8 weeks  PLANNED INTERVENTIONS: Therapeutic exercises, Therapeutic activity, Neuromuscular re-education, Balance training, Gait training,  Patient/Family education, Self Care, Joint mobilization, Joint manipulation, Stair training, Aquatic Therapy, Dry Needling, Electrical stimulation, Spinal manipulation, Spinal mobilization, Cryotherapy, Moist heat, Taping, Traction, Ultrasound, Ionotophoresis 4mg /ml Dexamethasone, Manual therapy, and Re-evaluation.  PLAN FOR NEXT SESSION: Assess and progress HEP as indicated, core stability, flexibility, strengthening   Reather Laurence, PT 08/28/22 9:54 AM  [facility] [address], [address] [place], [place] [address] Phone # [phone] Fax [phone]

## 2022-08-28 NOTE — Telephone Encounter (Signed)
Sent to [place] to schedule [phone] 

## 2022-08-28 NOTE — Telephone Encounter (Signed)
Sent to [place] to schedule [phone] 

## 2022-08-28 NOTE — Telephone Encounter (Signed)
We can scheduled at 430PM, any afternoon this week. Thank you

## 2022-08-28 NOTE — Telephone Encounter (Signed)
Peer to peer completed. Amyloid pet scan approved #[id number], exp 02/24/2023

## 2022-08-28 NOTE — Telephone Encounter (Signed)
His insurance is requiring a peer to peer be done in order to approve the pet scan. I am able to schedule it online-can you please give me a date and time that would work for you? This needs to be done by 5/24 or they will deny it. Thanks!

## 2022-08-28 NOTE — Telephone Encounter (Signed)
Peer to peer completed. Amyloid pet scan approved, #[id number]. Exp 02/24/2023.

## 2022-08-28 NOTE — Telephone Encounter (Signed)
Peer to peer scheduled for today, 5/21 at 4:30pm they will call your cell phone. 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 Thanks! Case #[id number]

## 2022-08-29 ENCOUNTER — Encounter (HOSPITAL_BASED_OUTPATIENT_CLINIC_OR_DEPARTMENT_OTHER): Payer: Medicare HMO | Admitting: Physical Therapy

## 2022-08-29 DIAGNOSIS — R2689 Other abnormalities of gait and mobility: Secondary | ICD-10-CM

## 2022-08-29 DIAGNOSIS — M6281 Muscle weakness (generalized): Secondary | ICD-10-CM | POA: Diagnosis not present

## 2022-08-29 DIAGNOSIS — M5459 Other low back pain: Secondary | ICD-10-CM

## 2022-08-29 DIAGNOSIS — R252 Cramp and spasm: Secondary | ICD-10-CM

## 2022-08-29 NOTE — Therapy (Signed)
OUTPATIENT PHYSICAL THERAPY TREATMENT NOTE   Patient Name: Evan [name] MRN: [id number] DOB:1951-03-02, 72 y.o., male Today's Date: 08/29/2022  END OF SESSION:  PT End of Session - 08/29/22 0910     Visit Number 4    Date for PT Re-Evaluation 10/12/22    Authorization Type [employer] Medicare    Progress Note Due on Visit 10    PT Start Time 0900    PT Stop Time 0940    PT Time Calculation (min) 40 min    Activity Tolerance Patient tolerated treatment well    Behavior During Therapy WFL for tasks assessed/performed              Past Medical History:  Diagnosis Date   Aortic atherosclerosis (HCC)    CAD (coronary artery disease)    Diverticulosis    Emphysema lung (HCC)    Hypercholesteremia    Hyperlipemia    Hypertension    Memory changes    Smoker    Tinnitus of both ears    Past Surgical History:  Procedure Laterality Date   HAND SURGERY     multiple reconstructive surgeries    HEMORRHOID SURGERY  01/23/11   HEMORRHOID SURGERY     HERNIA REPAIR     LEG SURGERY     Patient Active Problem List   Diagnosis Date Noted   Memory loss 08/09/2022   Sciatica 08/07/2022   Coronary artery disease involving native coronary artery of native heart without angina pectoris 07/12/2021   Former smoker 06/08/2016   Atypical chest pain 06/08/2016   Pure hypercholesterolemia 06/08/2016   Family history of early CAD 06/08/2016   RBBB 06/08/2016    PCP: Darrow Bussing, MD  REFERRING PROVIDER: Delfin Gant, MD  REFERRING DIAG: M54.50 (ICD-10-CM) - Low back pain, unspecified  Rationale for Evaluation and Treatment: Rehabilitation  THERAPY DIAG:  Other low back pain  Cramp and spasm  Other abnormalities of gait and mobility  Muscle weakness (generalized)  ONSET DATE: 07/14/2022  SUBJECTIVE:                                                                                                                                                                                            SUBJECTIVE STATEMENT: Pt reports that he did well after last aquatic session; wasn't sore.  He reports that his Lt knee buckled yesterday, but he didn't fall.  "I still don't trust it".  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 Yes: NPRS scale: currently 2-3/10 Pain location: Lt lateral hip and  Lt knee  Pain description: sore pain Aggravating factors: driving for prolonged periods Relieving factors: medication  PRECAUTIONS: Fall  WEIGHT BEARING RESTRICTIONS: No  FALLS:  Has patient fallen in last 6 months? Yes. Number of falls 2 falls in past month (reports left knee went out on him).  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  NEXT MD VISIT: as needed with Dr Penni Bombard  OBJECTIVE:   DIAGNOSTIC FINDINGS:  Lumbar Radiograph with Dr Penni Bombard unremarkable per patient  PATIENT SURVEYS:  Eval:  FOTO 45% (projected 66% by visit 10)  SCREENING FOR RED FLAGS: Bowel or bladder incontinence: No Spinal tumors: No Cauda equina syndrome: No Compression fracture: No Abdominal aneurysm: No  COGNITION: Overall cognitive status: Within functional limits for tasks assessed.  Does state that he has noted some memory changes, so he is working with a Insurance account manager to further assess.   SENSATION: Denies numbness or tingling, but does have some radicular pain down LLE.  MUSCLE LENGTH: Hamstrings: tightness noted  POSTURE: rounded shoulders and forward head  PALPATION: Some spasms noted in lumbar multifidi  LUMBAR ROM:    WFL  LOWER EXTREMITY ROM:     WFL  LOWER EXTREMITY MMT:    08/21/2022: Right leg strength is WFL Left hip strength is 4/5, left quad strength is 4/5, left hamstring strength is 4+/5  LUMBAR SPECIAL TESTS:  Slump test: slight positive on left side  FUNCTIONAL TESTS:  Eval: 5 times sit to stand: 13.90 sec with UE sec with decreased weight bearing through LLE  GAIT: Distance walked: >50 ft Assistive device utilized: None Level of assistance: Complete Independence Comments: Patient reports that he has been wearing the left knee brace secondary to him feeling that at times his left leg may give out on him.  TODAY'S TREATMENT:                                                                                                                              DATE: 08/29/2022  Pt seen for aquatic therapy today.  Treatment took place in water 3.5-4.75 ft in depth at the [facility] pool. Temp of water was 91.  Pt entered/exited the pool via stairs independently with bilat rail. * unsupported:  walking forward/ backward with cues for vertical trunk * side stepping without/ with arm abdct/ addct x 3 total laps * monster walk forward/backward; walking LE circumduction forward/backward with mod cues for technique (UE On noodle) * UE on yellow noodle:  hip abdct/ addct x 10 and leg swings into hip flex/ ext - multiple reps * wood chop with single small triangle hand float x 6 x 2 sets *  Lt runners step ups and Rt slow step down x 10 with UE on rails * Rt step down/ Lt retro step up x 8 with UE on rails *Lt lateral step up x 5 with UE on rails  DATE: 08/28/2022  Recumbent bike level 1.0 x6 min with PT present to discuss status Supine hamstring stretch with strap 2x20 sec bilat Supine posterior pelvic tilt 2x10 (with cuing for improved technique) Supine lower trunk rotation x10 bilat Supine single knee to chest 2x20 sec bilat Supine bridge 2x10 Supine posterior pelvic tilt with  marching 2x10 bilat Prone hip extension 2x10 bilat Side-lying clamshell x15 bilat   DATE: 08/22/2022  Pt seen for aquatic therapy today.  Treatment took place in water 3.5-4.75 ft in depth at the [facility] pool. Temp of water was 91.  Pt entered/exited the pool via stairs independently with bilat rail. * unsupported:  walking forward/ backward with cues for vertical trunk * side stepping without/ with arm abdct/ addct (ankle floats increased Rt shoulder pain- limited to 1 lap) - x 3 total laps * high knee marching forward/ backward  * UE on yellow noodle: 3 way LE kick 2 x 5 * bilat hands in single med bell - golf swing with feet swivel * staggered stance with reciprocal arm swing with med bells (pink) * TrA set with short noodle pull down (Lt thumb in hole) -> hands flat on kick board  * staggered stance with kick board row * STS with feet on blue step, cues for slow return to bench, hip hinge, and core activated * piriformis stretch when sitting on bench in water L/R/L   Pt requires the buoyancy and hydrostatic pressure of water for support, and to offload joints by unweighting joint load by at least 50 % in navel deep water and by at least 75-80% in chest to neck deep water.  Viscosity of the water is needed for resistance of strengthening. Water current perturbations provides challenge to standing balance requiring increased core activation.     PATIENT EDUCATION:  Education details: aquatic progressions/ modifications Person educated: Patient Education method: Programmer, multimedia, Demonstration Education comprehension: verbalized understanding and returned demonstration  HOME EXERCISE PROGRAM: Access Code: [email or web address] URL: [email or web address] Date: 08/28/2022 Prepared by: Clydie Braun Menke  Exercises - Supine Pelvic Tilt  - 1 x daily - 7 x weekly - 2 sets - 10 reps - Supine Single Knee to Chest Stretch  - 1 x daily - 7 x weekly - 1 sets - 2 reps - 20 sec hold -  Supine Lower Trunk Rotation  - 1 x daily - 7 x weekly - 1 sets - 10 reps - Supine Bridge  - 1 x daily - 7 x weekly - 2 sets - 10 reps - Supine Hamstring Stretch with Strap  - 1 x daily - 7 x weekly - 1 sets - 2 reps - 20 sec hold - Supine Piriformis Stretch with Foot on Ground  - 1 x daily - 7 x weekly - 1 sets - 2 reps - 20 sec hold - Supine March with Posterior Pelvic Tilt  - 1 x daily - 7 x weekly - 2 sets - 10 reps - Clamshell  - 1 x daily - 7 x weekly - 1-2 sets - 10 reps - Prone Knee Flexion  - 1 x daily - 7 x weekly - 1 sets - 10 reps  ASSESSMENT:  CLINICAL IMPRESSION: Pt tolerated progression of aquatic exercises with only minor increase in Lt hip/knee  pain when walking with wide gait (monster walk); reduced with change in exercise.  Pt was able to ascend / descend step with LLE with greater ease with cues, UE support and repetition.  May want to try wood chop (and reverse) on land next visit (for return to golf).  Plan to issue aquatic HEP next visit.   Patient progressing towards goals.    OBJECTIVE IMPAIRMENTS: decreased balance, difficulty walking, decreased strength, increased muscle spasms, impaired flexibility, postural dysfunction, and pain.   ACTIVITY LIMITATIONS: lifting, bending, and squatting  PARTICIPATION LIMITATIONS: driving and community activity  PERSONAL FACTORS: Time since onset of injury/illness/exacerbation are also affecting patient's functional outcome.   REHAB POTENTIAL: Good  CLINICAL DECISION MAKING: Stable/uncomplicated  EVALUATION COMPLEXITY: Low   GOALS: Goals reviewed with patient? Yes  SHORT TERM GOALS: Target date: 09/07/2022  Patient will be independent with initial HEP. Baseline: Goal status: IN PROGRESS  2.  Patient will report at least a 40% improvement in symptoms during daily activities. Baseline:  Goal status: IN PROGRESS   LONG TERM GOALS: Target date: 10/12/2022  Patient will be independent with advanced HEP. Baseline:   Goal status: INITIAL  2.  Patient will increase FOTO to at least 66% to demonstrate improvements in functional mobility. Baseline: 45% Goal status: INITIAL  3.  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 Baseline:  Goal status: INITIAL  4.  Patient will report ability to return to golf with no increase in pain. Baseline:  Goal status: INITIAL  5.  Patient will improve functional balance and not have any instances of losing balance or near loss of balance in 2 weeks leading up to discharge. Baseline:  Goal status: INITIAL   PLAN:  PT FREQUENCY: 2x/week  PT DURATION: 8 weeks  PLANNED INTERVENTIONS: Therapeutic exercises, Therapeutic activity, Neuromuscular re-education, Balance training, Gait training, Patient/Family education, Self Care, Joint mobilization, Joint manipulation, Stair training, Aquatic Therapy, Dry Needling, Electrical stimulation, Spinal manipulation, Spinal mobilization, Cryotherapy, Moist heat, Taping, Traction, Ultrasound, Ionotophoresis 4mg /ml Dexamethasone, Manual therapy, and Re-evaluation.  PLAN FOR NEXT SESSION: Assess and progress HEP as indicated, core stability, flexibility, strengthening  Mayer Camel, PTA 08/29/22 12:22 PM [facility] [address] [place], [place], [address] Phone: [phone]   Fax:  [phone]

## 2022-09-04 ENCOUNTER — Ambulatory Visit: Payer: Medicare HMO | Admitting: Rehabilitative and Restorative Service Providers"

## 2022-09-04 ENCOUNTER — Encounter: Payer: Self-pay | Admitting: Rehabilitative and Restorative Service Providers"

## 2022-09-04 DIAGNOSIS — R252 Cramp and spasm: Secondary | ICD-10-CM

## 2022-09-04 DIAGNOSIS — R2689 Other abnormalities of gait and mobility: Secondary | ICD-10-CM | POA: Diagnosis not present

## 2022-09-04 DIAGNOSIS — M5459 Other low back pain: Secondary | ICD-10-CM | POA: Diagnosis not present

## 2022-09-04 DIAGNOSIS — M6281 Muscle weakness (generalized): Secondary | ICD-10-CM

## 2022-09-04 NOTE — Therapy (Signed)
OUTPATIENT PHYSICAL THERAPY TREATMENT NOTE   Patient Name: Evan [name] MRN: [id number] DOB:Aug 22, 1950, 72 y.o., male Today's Date: 09/04/2022  END OF SESSION:  PT End of Session - 09/04/22 0805     Visit Number 5    Date for PT Re-Evaluation 10/12/22    Authorization Type [employer]    Progress Note Due on Visit 10    PT Start Time 0800    PT Stop Time 0840    PT Time Calculation (min) 40 min    Activity Tolerance Patient tolerated treatment well    Behavior During Therapy WFL for tasks assessed/performed              Past Medical History:  Diagnosis Date   Aortic atherosclerosis (HCC)    CAD (coronary artery disease)    Diverticulosis    Emphysema lung (HCC)    Hypercholesteremia    Hyperlipemia    Hypertension    Memory changes    Smoker    Tinnitus of both ears    Past Surgical History:  Procedure Laterality Date   HAND SURGERY     multiple reconstructive surgeries    HEMORRHOID SURGERY  01/23/11   HEMORRHOID SURGERY     HERNIA REPAIR     LEG SURGERY     Patient Active Problem List   Diagnosis Date Noted   Memory loss 08/09/2022   Sciatica 08/07/2022   Coronary artery disease involving native coronary artery of native heart without angina pectoris 07/12/2021   Former smoker 06/08/2016   Atypical chest pain 06/08/2016   Pure hypercholesterolemia 06/08/2016   Family history of early CAD 06/08/2016   RBBB 06/08/2016    PCP: Darrow Bussing, MD  REFERRING PROVIDER: Delfin Gant, MD  REFERRING DIAG: M54.50 (ICD-10-CM) - Low back pain, unspecified  Rationale for Evaluation and Treatment: Rehabilitation  THERAPY DIAG:  Other low back pain  Cramp and spasm  Other abnormalities of gait and mobility  Muscle weakness (generalized)  ONSET DATE: 07/14/2022  SUBJECTIVE:                                                                                                                                                                                            SUBJECTIVE STATEMENT: Pt reports that he is always stiffest in the mornings for about an hour.  Patient reports that he has started using his left knee more, but he still feels that it is not as stable.  Pt states that he generally does not have pain after the initial morning pain.  States that he was able to go to the driving range and did not  have any increased pain.  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 Yes: NPRS scale: currently 3-4/10 Pain location: Lt lateral hip and Lt knee  Pain description: sore pain Aggravating factors: driving for prolonged periods Relieving factors: medication  PRECAUTIONS: Fall  WEIGHT BEARING RESTRICTIONS: No  FALLS:  Has patient fallen in last 6 months? Yes. Number of falls 2 falls in past month (reports left knee went out on him).  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  NEXT MD VISIT: as needed with Dr Penni Bombard  OBJECTIVE:   DIAGNOSTIC FINDINGS:  Lumbar Radiograph with Dr Penni Bombard unremarkable per patient  PATIENT SURVEYS:  Eval:  FOTO 45% (projected 66% by visit 10)  SCREENING FOR RED FLAGS: Bowel or bladder incontinence: No Spinal tumors: No Cauda equina syndrome: No Compression fracture: No Abdominal aneurysm: No  COGNITION: Overall cognitive status: Within functional limits for tasks assessed.  Does state that he has noted some memory changes, so he is working with a Insurance account manager to further assess.   SENSATION: Denies numbness or tingling, but does have some  radicular pain down LLE.  MUSCLE LENGTH: Hamstrings: tightness noted  POSTURE: rounded shoulders and forward head  PALPATION: Some spasms noted in lumbar multifidi  LUMBAR ROM:   WFL  LOWER EXTREMITY ROM:     WFL  LOWER EXTREMITY MMT:    08/21/2022: Right leg strength is WFL Left hip strength is 4/5, left quad strength is 4/5, left hamstring strength is 4+/5  LUMBAR SPECIAL TESTS:  Slump test: slight positive on left side  FUNCTIONAL TESTS:  Eval: 5 times sit to stand: 13.90 sec with UE sec with decreased weight bearing through LLE  GAIT: Distance walked: >50 ft Assistive device utilized: None Level of assistance: Complete Independence Comments: Patient reports that he has been wearing the left knee brace secondary to him feeling that at times his left leg may give out on him.  TODAY'S TREATMENT:                                                                                                                               DATE: 09/04/2022  Recumbent bike level 2.0 x7 min with PT present to discuss status Standing hamstring stretch at stairs 2x20 sec bilat Standing L stretch at counter 2x20 sec Seated sciatic nerve tensioner x10 bilat Prone "Y" 2x10 bilat Prone hamstring curl 2x10 bilat Prone press-up x10 Side-lying clamshell 2x10 bilat   DATE: 08/29/2022  Pt seen for aquatic therapy today.  Treatment took place in water 3.5-4.75 ft in depth at  the [facility] pool. Temp of water was 91.  Pt entered/exited the pool via stairs independently with bilat rail. * unsupported:  walking forward/ backward with cues for vertical trunk * side stepping without/ with arm abdct/ addct x 3 total laps * monster walk forward/backward; walking LE circumduction forward/backward with mod cues for technique (UE On noodle) * UE on yellow noodle:  hip abdct/ addct x 10 and leg swings into hip flex/ ext - multiple reps * wood chop with single small triangle hand float x 6 x 2  sets * Lt runners step ups and Rt slow step down x 10 with UE on rails * Rt step down/ Lt retro step up x 8 with UE on rails *Lt lateral step up x 5 with UE on rails   DATE: 08/28/2022  Recumbent bike level 1.0 x6 min with PT present to discuss status Supine hamstring stretch with strap 2x20 sec bilat Supine posterior pelvic tilt 2x10 (with cuing for improved technique) Supine lower trunk rotation x10 bilat Supine single knee to chest 2x20 sec bilat Supine bridge 2x10 Supine posterior pelvic tilt with marching 2x10 bilat Prone hip extension 2x10 bilat Side-lying clamshell x15 bilat      PATIENT EDUCATION:  Education details: aquatic progressions/ modifications Person educated: Patient Education method: Programmer, multimedia, Demonstration Education comprehension: verbalized understanding and returned demonstration  HOME EXERCISE PROGRAM: Access Code: [email or web address] URL: [email or web address] Date: 09/04/2022 Prepared by: Clydie Braun Alizea Pell  Exercises - Supine Single Knee to Chest Stretch  - 1 x daily - 7 x weekly - 1 sets - 2 reps - 20 sec hold - Supine Lower Trunk Rotation  - 1 x daily - 7 x weekly - 1 sets - 10 reps - Supine Bridge  - 1 x daily - 7 x weekly - 2 sets - 10 reps - Supine Hamstring Stretch with Strap  - 1 x daily - 7 x weekly - 1 sets - 2 reps - 20 sec hold - Supine Piriformis Stretch with Foot on Ground  - 1 x daily - 7 x weekly - 1 sets - 2 reps - 20 sec hold - Supine March with Posterior Pelvic Tilt  - 1 x daily - 7 x weekly - 2 sets - 10 reps - Clamshell  - 1 x daily - 7 x weekly - 1-2 sets - 10 reps - Prone Knee Flexion  - 1 x daily - 7 x weekly - 1 sets - 10 reps - Prone Scapular Retraction Y  - 1 x daily - 7 x weekly - 2 sets - 10 reps - Prone Press Up  - 1 x daily - 7 x weekly - 2 sets - 10 reps - Cat Cow  - 1 x daily - 7 x weekly - 2 sets - 10 reps - Standing 'L' Stretch at Counter  - 1 x daily - 7 x weekly - 1 sets - 2 reps - 20 sec hold - Seated Sciatic  Tensioner  - 1 x daily - 7 x weekly - 2 sets - 10 reps - 1 sec hold  ASSESSMENT:  CLINICAL IMPRESSION: Mr Schetter presents to skilled PT bringing some of the exercises that he has been performing at home and that he found online when he searched for "sciatic nerve stretches."  Reviewed exercises and consolidated all exercises to MedBridge exercises and updated HEP handout for patient's use.  Patient able to demonstrate exercises in session with minimal cuing for improved technique.  Patient was able to go  to the driving range to start swinging some clubs and denies pain with hitting balls.  Patient states that he has been trying to navigate stairs with reciprocal pattern, but has some difficulty with descending stairs.  Patient continues to report great gains from land and aquatic based session.   OBJECTIVE IMPAIRMENTS: decreased balance, difficulty walking, decreased strength, increased muscle spasms, impaired flexibility, postural dysfunction, and pain.   ACTIVITY LIMITATIONS: lifting, bending, and squatting  PARTICIPATION LIMITATIONS: driving and community activity  PERSONAL FACTORS: Time since onset of injury/illness/exacerbation are also affecting patient's functional outcome.   REHAB POTENTIAL: Good  CLINICAL DECISION MAKING: Stable/uncomplicated  EVALUATION COMPLEXITY: Low   GOALS: Goals reviewed with patient? Yes  SHORT TERM GOALS: Target date: 09/07/2022  Patient will be independent with initial HEP. Baseline: Goal status: IN PROGRESS  2.  Patient will report at least a 40% improvement in symptoms during daily activities. Baseline:  Goal status: IN PROGRESS   LONG TERM GOALS: Target date: 10/12/2022  Patient will be independent with advanced HEP. Baseline:  Goal status: IN PROGRESS  2.  Patient will increase FOTO to at least 66% to demonstrate improvements in functional mobility. Baseline: 45% Goal status: INITIAL  3.  Patient will increase left hip/quad strength to  at least 5-/5 to allow him to return to carrying heavy items for volunteer work. Baseline:  Goal status: INITIAL  4.  Patient will report ability to return to golf with no increase in pain. Baseline:  Goal status: INITIAL  5.  Patient will improve functional balance and not have any instances of losing balance or near loss of balance in 2 weeks leading up to discharge. Baseline:  Goal status: INITIAL   PLAN:  PT FREQUENCY: 2x/week  PT DURATION: 8 weeks  PLANNED INTERVENTIONS: Therapeutic exercises, Therapeutic activity, Neuromuscular re-education, Balance training, Gait training, Patient/Family education, Self Care, Joint mobilization, Joint manipulation, Stair training, Aquatic Therapy, Dry Needling, Electrical stimulation, Spinal manipulation, Spinal mobilization, Cryotherapy, Moist heat, Taping, Traction, Ultrasound, Ionotophoresis 4mg /ml Dexamethasone, Manual therapy, and Re-evaluation.  PLAN FOR NEXT SESSION: Assess and progress HEP as indicated, core stability, flexibility, strengthening, aquatic PT   Reather Laurence, PT 09/04/22 8:56 AM  [facility] [address], [address] [place], [place] [address] Phone # [phone] Fax [phone]

## 2022-09-06 ENCOUNTER — Ambulatory Visit: Payer: Medicare HMO | Admitting: Physical Therapy

## 2022-09-07 ENCOUNTER — Ambulatory Visit (HOSPITAL_BASED_OUTPATIENT_CLINIC_OR_DEPARTMENT_OTHER): Payer: Medicare HMO | Admitting: Physical Therapy

## 2022-09-07 ENCOUNTER — Encounter (HOSPITAL_BASED_OUTPATIENT_CLINIC_OR_DEPARTMENT_OTHER): Payer: Self-pay | Admitting: Physical Therapy

## 2022-09-07 DIAGNOSIS — M545 Low back pain, unspecified: Secondary | ICD-10-CM | POA: Diagnosis not present

## 2022-09-07 DIAGNOSIS — M79605 Pain in left leg: Secondary | ICD-10-CM | POA: Diagnosis not present

## 2022-09-07 DIAGNOSIS — R2689 Other abnormalities of gait and mobility: Secondary | ICD-10-CM

## 2022-09-07 DIAGNOSIS — M6281 Muscle weakness (generalized): Secondary | ICD-10-CM

## 2022-09-07 DIAGNOSIS — M5459 Other low back pain: Secondary | ICD-10-CM | POA: Diagnosis not present

## 2022-09-07 DIAGNOSIS — R252 Cramp and spasm: Secondary | ICD-10-CM | POA: Diagnosis not present

## 2022-09-07 NOTE — Therapy (Signed)
OUTPATIENT PHYSICAL THERAPY TREATMENT NOTE   Patient Name: Evan [name] MRN: [id number] DOB:[birth year], 72 y.o., male Today's Date: 09/07/2022  END OF SESSION:  PT End of Session - 09/07/22 0959     Visit Number 6    Date for PT Re-Evaluation 10/12/22    Authorization Type [employer] Medicare    Progress Note Due on Visit 10    PT Start Time 0945    PT Stop Time 1025    PT Time Calculation (min) 40 min    Behavior During Therapy [facility] for tasks assessed/performed              Past Medical History:  Diagnosis Date   Aortic atherosclerosis (HCC)    CAD (coronary artery disease)    Diverticulosis    Emphysema lung (HCC)    Hypercholesteremia    Hyperlipemia    Hypertension    Memory changes    Smoker    Tinnitus of both ears    Past Surgical History:  Procedure Laterality Date   HAND SURGERY     multiple reconstructive surgeries    HEMORRHOID SURGERY  01/23/11   HEMORRHOID SURGERY     HERNIA REPAIR     LEG SURGERY     Patient Active Problem List   Diagnosis Date Noted   Memory loss 08/09/2022   Sciatica 08/07/2022   Coronary artery disease involving native coronary artery of native heart without angina pectoris 07/12/2021   Former smoker 06/08/2016   Atypical chest pain 06/08/2016   Pure hypercholesterolemia 06/08/2016   Family history of early CAD 06/08/2016   RBBB 06/08/2016    PCP: Darrow Bussing, MD  REFERRING PROVIDER: Delfin Gant, MD  REFERRING DIAG: M54.50 (ICD-10-CM) - Low back pain, unspecified  Rationale for Evaluation and Treatment: Rehabilitation  THERAPY DIAG:  Other low back pain  Cramp and spasm  Other abnormalities of gait and mobility  Muscle weakness (generalized)  ONSET DATE: 07/14/2022  SUBJECTIVE:                                                                                                                                                                                           SUBJECTIVE STATEMENT: Pt reports he was  able to golf without only minor twinges.  He reports he continues to not trust his Lt knee and is fearful of it buckling.   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 Yes: NPRS scale: currently 0.5/10 ( took pain medication in AM) Pain location: Lt lateral hip and Lt knee  Pain description:  sore pain Aggravating factors: driving for prolonged periods Relieving factors: medication  PRECAUTIONS: Fall  WEIGHT BEARING RESTRICTIONS: No  FALLS:  Has patient fallen in last 6 months? Yes. Number of falls 2 falls in past month (reports left knee went out on him).  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  NEXT MD VISIT: as needed with Dr Penni Bombard  OBJECTIVE:   DIAGNOSTIC FINDINGS:  Lumbar Radiograph with Dr Penni Bombard unremarkable per patient  PATIENT SURVEYS:  Eval:  FOTO 45% (projected 66% by visit 10)  SCREENING FOR RED FLAGS: Bowel or bladder incontinence: No Spinal tumors: No Cauda equina syndrome: No Compression fracture: No Abdominal aneurysm: No  COGNITION: Overall cognitive status: Within functional limits for tasks assessed.  Does state that he has noted some memory changes, so he is working with a Insurance account manager to further assess.   SENSATION: Denies numbness or tingling, but does have some radicular pain down LLE.  MUSCLE LENGTH: Hamstrings: tightness noted  POSTURE: rounded shoulders and forward head  PALPATION: Some spasms noted in lumbar multifidi  LUMBAR ROM:   WFL  LOWER EXTREMITY ROM:      WFL  LOWER EXTREMITY MMT:    08/21/2022: Right leg strength is WFL Left hip strength is 4/5, left quad strength is 4/5, left hamstring strength is 4+/5  LUMBAR SPECIAL TESTS:  Slump test: slight positive on left side  FUNCTIONAL TESTS:  Eval: 5 times sit to stand: 13.90 sec with UE sec with decreased weight bearing through LLE  GAIT: Distance walked: >50 ft Assistive device utilized: None Level of assistance: Complete Independence Comments: Patient reports that he has been wearing the left knee brace secondary to him feeling that at times his left leg may give out on him.  TODAY'S TREATMENT:                                                                                                                              DATE: 09/07/2022  Reviewed 3 HEP exercises to complete in AM - supine hamstring with strap, LTR, and seated childs pose   Pt seen for aquatic therapy today.  Treatment took place in water 3.5-4.75 ft in depth at the [facility] pool. Temp of water was 91.  Pt entered/exited the pool via stairs independently with bilat rail. * unsupported:  walking forward/ backward and  side stepping without/ with arm abdct/ addct * high knee marching forward/ backward with arm swing * wood chop with small yellow ball 2 x 5 * walking LE circumduction forward/backward with mod cues for technique (UE On noodle) * Lt runners step ups and Rt retro slow step down x  10 with UE on rails - 1st step - then 5 steps up with less submersion x 2 * Rt forward step downs, Lt retro step ups x 8 with BUEon rails * UE on yellow noodle:  hip abdct/ addct x 10 and leg swings into hip flex/ ext - multiple reps * L/R/L quad stretch with foot on2nd step behind him   *Lt lateral step up x 5 with UE on rails DATE: 09/04/2022  Recumbent bike level 2.0 x7 min with PT present to discuss status Standing hamstring stretch at stairs 2x20 sec bilat Standing L stretch at counter 2x20 sec Seated sciatic  nerve tensioner x10 bilat Prone "Y" 2x10 bilat Prone hamstring curl 2x10 bilat Prone press-up x10 Side-lying clamshell 2x10 bilat   DATE: 08/29/2022  Pt seen for aquatic therapy today.  Treatment took place in water 3.5-4.75 ft in depth at the [facility] pool. Temp of water was 91.  Pt entered/exited the pool via stairs independently with bilat rail. * unsupported:  walking forward/ backward with cues for vertical trunk * side stepping without/ with arm abdct/ addct x 3 total laps * monster walk forward/backward; walking LE circumduction forward/backward with mod cues for technique (UE On noodle) * UE on yellow noodle:  hip abdct/ addct x 10 and leg swings into hip flex/ ext - multiple reps * wood chop with single small triangle hand float x 6 x 2 sets * Lt runners step ups and Rt slow step down x 10 with UE on rails * Rt step down/ Lt retro step up x 8 with UE on rails *Lt lateral step up x 5 with UE on rails   DATE: 08/28/2022  Recumbent bike level 1.0 x6 min with PT present to discuss status Supine hamstring stretch with strap 2x20 sec bilat Supine posterior pelvic tilt 2x10 (with cuing for improved technique) Supine lower trunk rotation x10 bilat Supine single knee to chest 2x20 sec bilat Supine bridge 2x10 Supine posterior pelvic tilt with marching 2x10 bilat Prone hip extension 2x10 bilat Side-lying clamshell x15 bilat      PATIENT EDUCATION:  Education details: aquatic progressions/ modifications Person educated: Patient Education method: Programmer, multimedia, Demonstration Education comprehension: verbalized understanding and returned demonstration  HOME EXERCISE PROGRAM: Access Code: [email or web address] URL: [email or web address] Date: 09/04/2022 Prepared by: Clydie Braun Menke  Exercises - Supine Single Knee to Chest Stretch  - 1 x daily - 7 x weekly - 1 sets - 2 reps - 20 sec hold - Supine Lower Trunk Rotation  - 1 x daily - 7 x weekly - 1 sets - 10 reps -  Supine Bridge  - 1 x daily - 7 x weekly - 2 sets - 10 reps - Supine Hamstring Stretch with Strap  - 1 x daily - 7 x weekly - 1 sets - 2 reps - 20 sec hold - Supine Piriformis Stretch with Foot on Ground  - 1 x daily - 7 x weekly - 1 sets - 2 reps - 20 sec hold - Supine March with Posterior Pelvic Tilt  - 1 x daily - 7 x weekly - 2 sets - 10 reps - Clamshell  - 1 x daily - 7 x weekly - 1-2 sets - 10 reps - Prone Knee Flexion  - 1 x daily - 7 x weekly - 1 sets - 10 reps - Prone Scapular Retraction Y  - 1 x daily - 7 x weekly - 2 sets - 10 reps - Prone Press Up  - 1 x  daily - 7 x weekly - 2 sets - 10 reps - Cat Cow  - 1 x daily - 7 x weekly - 2 sets - 10 reps - Standing 'L' Stretch at Counter  - 1 x daily - 7 x weekly - 1 sets - 2 reps - 20 sec hold - Seated Sciatic Tensioner  - 1 x daily - 7 x weekly - 2 sets - 10 reps - 1 sec hold  ASSESSMENT:  CLINICAL IMPRESSION: Pt with good tolerance to aquatic exercises, without any production of pain.  Encouragement given while working on step downs (retro and forward) working Lt quad.  Pt making good progress towards goals and will benefit from continued skill PT intervention to assist with return to PLOF.    OBJECTIVE IMPAIRMENTS: decreased balance, difficulty walking, decreased strength, increased muscle spasms, impaired flexibility, postural dysfunction, and pain.   ACTIVITY LIMITATIONS: lifting, bending, and squatting  PARTICIPATION LIMITATIONS: driving and community activity  PERSONAL FACTORS: Time since onset of injury/illness/exacerbation are also affecting patient's functional outcome.   REHAB POTENTIAL: Good  CLINICAL DECISION MAKING: Stable/uncomplicated  EVALUATION COMPLEXITY: Low   GOALS: Goals reviewed with patient? Yes  SHORT TERM GOALS: Target date: 09/07/2022  Patient will be independent with initial HEP. Baseline: Goal status: IN PROGRESS  2.  Patient will report at least a 40% improvement in symptoms during daily  activities. Baseline:  Goal status: IN PROGRESS   LONG TERM GOALS: Target date: 10/12/2022  Patient will be independent with advanced HEP. Baseline:  Goal status: IN PROGRESS  2.  Patient will increase FOTO to at least 66% to demonstrate improvements in functional mobility. Baseline: 45% Goal status: INITIAL  3.  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 Baseline:  Goal status: INITIAL  4.  Patient will report ability to return to golf with no increase in pain. Baseline:  Goal status: INITIAL  5.  Patient will improve functional balance and not have any instances of losing balance or near loss of balance in 2 weeks leading up to discharge. Baseline:  Goal status: INITIAL   PLAN:  PT FREQUENCY: 2x/week  PT DURATION: 8 weeks  PLANNED INTERVENTIONS: Therapeutic exercises, Therapeutic activity, Neuromuscular re-education, Balance training, Gait training, Patient/Family education, Self Care, Joint mobilization, Joint manipulation, Stair training, Aquatic Therapy, Dry Needling, Electrical stimulation, Spinal manipulation, Spinal mobilization, Cryotherapy, Moist heat, Taping, Traction, Ultrasound, Ionotophoresis 4mg /ml Dexamethasone, Manual therapy, and Re-evaluation.  PLAN FOR NEXT SESSION: Assess and progress HEP as indicated, core stability, flexibility, strengthening, aquatic PT   Mayer Camel, PTA 09/07/22 1:20 PM [facility] [address] [place], [place], [address] Phone: [phone]   Fax:  [phone]

## 2022-09-10 DIAGNOSIS — H35412 Lattice degeneration of retina, left eye: Secondary | ICD-10-CM | POA: Diagnosis not present

## 2022-09-10 DIAGNOSIS — H2513 Age-related nuclear cataract, bilateral: Secondary | ICD-10-CM | POA: Diagnosis not present

## 2022-09-10 DIAGNOSIS — Z0100 Encounter for examination of eyes and vision without abnormal findings: Secondary | ICD-10-CM | POA: Diagnosis not present

## 2022-09-10 DIAGNOSIS — H43811 Vitreous degeneration, right eye: Secondary | ICD-10-CM | POA: Diagnosis not present

## 2022-09-10 DIAGNOSIS — H35373 Puckering of macula, bilateral: Secondary | ICD-10-CM | POA: Diagnosis not present

## 2022-09-10 DIAGNOSIS — H18831 Recurrent erosion of cornea, right eye: Secondary | ICD-10-CM | POA: Diagnosis not present

## 2022-09-11 ENCOUNTER — Ambulatory Visit (HOSPITAL_BASED_OUTPATIENT_CLINIC_OR_DEPARTMENT_OTHER): Payer: Medicare HMO | Admitting: Physical Therapy

## 2022-09-11 ENCOUNTER — Other Ambulatory Visit (HOSPITAL_COMMUNITY): Payer: Self-pay

## 2022-09-11 ENCOUNTER — Ambulatory Visit
Admission: EM | Admit: 2022-09-11 | Discharge: 2022-09-11 | Disposition: A | Discharge status: Home / Self Care | Payer: Medicare HMO | Attending: Urgent Care | Admitting: Urgent Care

## 2022-09-11 ENCOUNTER — Encounter (HOSPITAL_BASED_OUTPATIENT_CLINIC_OR_DEPARTMENT_OTHER): Payer: Self-pay

## 2022-09-11 DIAGNOSIS — R2689 Other abnormalities of gait and mobility: Secondary | ICD-10-CM | POA: Insufficient documentation

## 2022-09-11 DIAGNOSIS — L247 Irritant contact dermatitis due to plants, except food: Secondary | ICD-10-CM | POA: Diagnosis not present

## 2022-09-11 DIAGNOSIS — M5459 Other low back pain: Secondary | ICD-10-CM

## 2022-09-11 DIAGNOSIS — R252 Cramp and spasm: Secondary | ICD-10-CM | POA: Insufficient documentation

## 2022-09-11 DIAGNOSIS — L02416 Cutaneous abscess of left lower limb: Secondary | ICD-10-CM

## 2022-09-11 DIAGNOSIS — M6281 Muscle weakness (generalized): Secondary | ICD-10-CM | POA: Insufficient documentation

## 2022-09-11 MED ORDER — DOXYCYCLINE HYCLATE 100 MG PO CAPS
100.0000 mg | ORAL_CAPSULE | Freq: Two times a day (BID) | ORAL | 0 refills | Status: DC
  Filled 2022-09-11: qty 20, 10d supply, fill #0

## 2022-09-11 MED ORDER — BACITRACIN ZINC 500 UNIT/GM EX OINT
1.0000 | TOPICAL_OINTMENT | Freq: Two times a day (BID) | CUTANEOUS | 0 refills | Status: DC
  Filled 2022-09-11: qty 120, 67d supply, fill #0

## 2022-09-11 MED ORDER — PREDNISONE 20 MG PO TABS
ORAL_TABLET | ORAL | 0 refills | Status: AC
  Filled 2022-09-11: qty 15, 10d supply, fill #0

## 2022-09-11 NOTE — ED Provider Notes (Signed)
[facility]RG[facility]  Note:  This document was prepared using Conservation officer, historic buildings and may include unintentional dictation errors.  MRN: [id number] DOB: 06/18/1950  Subjective:   Evan [name] is a 72 y.o. male presenting for 2 week history of persistent itchy blister like oozing rash over both of his lower legs. No pain, drainage of pus or bleeding, fever. Suspects that it was from exposure to poison oak.   No current facility-administered medications for this encounter.  Current Outpatient Medications:    acetaminophen (TYLENOL) 500 MG tablet, Take 1,000 mg by mouth every 6 (six) hours as needed., Disp: , Rfl:    aspirin EC 81 MG tablet, Take 1 tablet (81 mg total) by mouth daily. Swallow whole., Disp: 90 tablet, Rfl: 3   gabapentin (NEURONTIN) 100 MG capsule, Take 100 mg by mouth 3 (three) times daily., Disp: , Rfl:    losartan (COZAAR) 50 MG tablet, Take 50 mg by mouth at bedtime., Disp: , Rfl: 0   Multiple Vitamins-Minerals (MULTIVITAMIN MEN 50+ PO), Take by mouth., Disp: , Rfl:    naproxen sodium (ALEVE) 220 MG tablet, Take 220 mg by mouth daily as needed (for pain)., Disp: , Rfl:    nicotine polacrilex (NICORETTE) 2 MG gum, Take 2 mg by mouth as needed for smoking cessation., Disp: , Rfl:    Omega-3 Fatty Acids (FISH OIL) 1000 MG CAPS, Take 1,000 mg by mouth daily., Disp: , Rfl:    rosuvastatin (CRESTOR) 40 MG tablet, Take 1 tablet (40 mg total) by mouth daily., Disp: 90 tablet, Rfl: 3   TAMSULOSIN HCL PO, Take 0.4 mg by mouth daily., Disp: , Rfl:    Tdap (BOOSTRIX) 5-2.5-18.5 LF-MCG/0.5 injection, Inject into the muscle., Disp: 0.5 mL, Rfl: 0   Allergies  Allergen Reactions   Hydrochlorothiazide Other (See Comments)    Past Medical History:  Diagnosis Date   Aortic atherosclerosis (HCC)    CAD (coronary artery disease)    Diverticulosis    Emphysema lung (HCC)    Hypercholesteremia    Hyperlipemia    Hypertension    Memory changes    Smoker     Tinnitus of both ears      Past Surgical History:  Procedure Laterality Date   HAND SURGERY     multiple reconstructive surgeries    HEMORRHOID SURGERY  01/23/11   HEMORRHOID SURGERY     HERNIA REPAIR     LEG SURGERY      Family History  Problem Relation Age of Onset   Alzheimer's disease Father    Alzheimer's disease Paternal Uncle    Alzheimer's disease Paternal Grandfather     Social History   Tobacco Use   Smoking status: Former    Packs/day: 0    Types: Cigarettes    Quit date: 08/2015    Years since quitting: 7.0   Smokeless tobacco: Never  Vaping Use   Vaping Use: Never used  Substance Use Topics   Alcohol use: Not Currently    Comment: weekly   Drug use: No    ROS   Objective:   Vitals: BP 124/82 (BP Location: Left Arm)   Pulse 81   Temp 98.6 F (37 C) (Oral)   Resp 20   SpO2 96%   Physical Exam Constitutional:      General: He is not in acute distress.    Appearance: Normal appearance. He is well-developed. He is not ill-appearing, toxic-appearing or diaphoretic.  HENT:  Head: Normocephalic and atraumatic.     Right Ear: External ear normal.     Left Ear: External ear normal.     Nose: Nose normal.     Mouth/Throat:     Mouth: Mucous membranes are moist.  Eyes:     General: No scleral icterus.       Right eye: No discharge.        Left eye: No discharge.     Extraocular Movements: Extraocular movements intact.  Cardiovascular:     Rate and Rhythm: Normal rate and regular rhythm.     Heart sounds: Normal heart sounds. No murmur heard.    No friction rub. No gallop.  Pulmonary:     Effort: Pulmonary effort is normal. No respiratory distress.     Breath sounds: Normal breath sounds. No stridor. No wheezing, rhonchi or rales.  Musculoskeletal:       Legs:  Skin:    Findings: Rash (multiple patches of vesicular lesions over anterior right lower leg, posterior and lateral areas) present.  Neurological:     Mental Status: He is  alert and oriented to person, place, and time.  Psychiatric:        Mood and Affect: Mood normal.        Behavior: Behavior normal.        Thought Content: Thought content normal.    PROCEDURE NOTE: I&D of Abscess Verbal consent obtained. Local anesthesia with topical anaesthetic. Site cleansed with alcohol, chlorehexidine. Incision of <1/4cm was made using an 11 blade, 2cc expressed consisting of a mixture of pus and serosanguinous fluid. Wound cavity was explored with curved hemostats and loculations loosened. Cleansed and dressed.   Assessment and Plan :   PDMP not reviewed this encounter.  1. Irritant contact dermatitis due to plants, except food   2. Abscess of left lower leg    Successful I&D performed.  Wound care reviewed.  Start doxycycline for the abscess. This is likely secondary to an irritant contact dermatitis to poison oak or ivy. Use 10 day course of prednisone. Unlikely to be shingles, there is no pain, the distribution is not consistent with this.  Counseled patient on potential for adverse effects with medications prescribed/recommended today, ER and return-to-clinic precautions discussed, patient verbalized understanding.     Wallis Bamberg, [place] 09/11/22 1306

## 2022-09-11 NOTE — Therapy (Signed)
[facility] Outpatient Rehabilitation at [facility] [address] [place], [place], [address] Phone: [phone]   Fax:  [phone]  Patient Details  Name: Evan [name] MRN: [id number] Date of Birth: [birth year] Referring Provider:  Delfin Gant, MD  Encounter Date: 09/11/2022   Patient arrived to pool area.  When speaking to supervising PT, Geni Bers, he informed her that he had just come from Dr's office where the doctor popped his blisters from poison oak.   PT decided to hold therapy since he has open wounds; advised pt he can return next week when his skin has healed. Pt verbalized understanding.   No visit, no charge.   Mayer Camel, PTA 09/11/22 4:40 PM [place]

## 2022-09-11 NOTE — ED Triage Notes (Signed)
Pt c/o rash to both LE x 2 weeks-no relief with OTC meds-NAD-steady gait

## 2022-09-11 NOTE — Discharge Instructions (Signed)
Change your dressing 2-3 times daily. Every time you change your dressing, clean the wound gently with warm water and Dial antibacterial soap. Pat the wound dry, let it breathe for roughly an hour before covering it back up. When you reapply a dressing, apply Bacitracin ointment to the wound, then cover with non-stick/non-adherent gauze.  Secure the dressing with Coban. After 7-10 days, the wound should scab over nicely and then you don't have to continue doing dressings. Use prednisone for the irritant dermatitis from possible poison oak or poison ivy. For the abscess, start taking doxycycline.

## 2022-09-12 NOTE — Therapy (Signed)
OUTPATIENT PHYSICAL THERAPY TREATMENT NOTE   Patient Name: [name] MRN: [id number] DOB:[birth year], 72 y.o., male Today's Date: 09/13/2022  END OF SESSION:  PT End of Session - 09/13/22 0940     Visit Number 7    Date for PT Re-Evaluation 10/12/22    Authorization Type [employer]    Progress Note Due on Visit 10    PT Start Time [phone]    PT Stop Time 1017    PT Time Calculation (min) 40 min    Activity Tolerance Patient tolerated treatment well    Behavior During Therapy [facility] for tasks assessed/performed              Past Medical History:  Diagnosis Date   Aortic atherosclerosis (HCC)    CAD (coronary artery disease)    Diverticulosis    Emphysema lung (HCC)    Hypercholesteremia    Hyperlipemia    Hypertension    Memory changes    Smoker    Tinnitus of both ears    Past Surgical History:  Procedure Laterality Date   HAND SURGERY     multiple reconstructive surgeries    HEMORRHOID SURGERY  01/23/11   HEMORRHOID SURGERY     HERNIA REPAIR     LEG SURGERY     Patient Active Problem List   Diagnosis Date Noted   Memory loss 08/09/2022   Sciatica 08/07/2022   Coronary artery disease involving native coronary artery of native heart without angina pectoris 07/12/2021   Former smoker 06/08/2016   Atypical chest pain 06/08/2016   Pure hypercholesterolemia 06/08/2016   Family history of early CAD 06/08/2016   RBBB 06/08/2016    PCP: Darrow Bussing, MD  REFERRING PROVIDER: Delfin Gant, MD  REFERRING DIAG: M54.50 (ICD-10-CM) - Low back pain, unspecified  Rationale for Evaluation and Treatment: Rehabilitation  THERAPY DIAG:  Other low back pain  Cramp and spasm  Other abnormalities of gait and mobility  Muscle weakness (generalized)  ONSET DATE: 07/14/2022  SUBJECTIVE:                                                                                                                                                                                            SUBJECTIVE STATEMENT: I feel great. My hip no longer hurts. My knee only hurts intermittently. I still don't trust it. I have been off the meds x 2 days and no pain. I could even hit a few golf balls and had no pain.  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  Yes: NPRS scale: currently 0.5/10 ( took pain medication in AM) Pain location: Lt lateral hip and Lt knee  Pain description: sore pain Aggravating factors: driving for prolonged periods Relieving factors: medication  PRECAUTIONS: Fall  WEIGHT BEARING RESTRICTIONS: No  FALLS:  Has patient fallen in last 6 months? Yes. Number of falls 2 falls in past month (reports left knee went out on him).  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  NEXT MD VISIT: as needed with Dr Penni Bombard  OBJECTIVE:   DIAGNOSTIC FINDINGS:  Lumbar Radiograph with Dr Penni Bombard unremarkable per patient  PATIENT SURVEYS:  Eval:  FOTO 45% (projected 66% by visit 10)  SCREENING FOR RED FLAGS: Bowel or bladder incontinence: No Spinal tumors: No Cauda equina syndrome: No Compression fracture: No Abdominal aneurysm: No  COGNITION: Overall cognitive status: Within functional limits for tasks assessed.  Does state that he has noted some memory changes, so he is working with a Insurance account manager to further assess.   SENSATION: Denies numbness or tingling, but does have some radicular pain down LLE.  MUSCLE LENGTH: Hamstrings: tightness noted  POSTURE: rounded shoulders and forward  head  PALPATION: Some spasms noted in lumbar multifidi  LUMBAR ROM:   WFL  LOWER EXTREMITY ROM:     WFL  LOWER EXTREMITY MMT:    08/21/2022: Right leg strength is WFL Left hip strength is 4/5, left quad strength is 4/5, left hamstring strength is 4+/5  LUMBAR SPECIAL TESTS:  Slump test: slight positive on left side  FUNCTIONAL TESTS:  Eval: 5 times sit to stand: 13.90 sec with UE sec with decreased weight bearing through LLE  GAIT: Distance walked: >50 ft Assistive device utilized: None Level of assistance: Complete Independence Comments: Patient reports that he has been wearing the left knee brace secondary to him feeling that at times his left leg may give out on him.  TODAY'S TREATMENT:                                                                                                                              DATE: 09/13/2022  Recumbent bike level 2.0 x5 min with PT present to discuss status Standing hamstring stretch at stairs 2x20 sec bilat Standing L stretch at counter 2x20 sec Seated sciatic nerve tensioner x10 bilat Prone "Y" 2x10 bilat Prone hamstring curl 3# 2x10 bilat could feel in low back until performed pelvic press toward mat Prone press-up x15 Side-lying clamshell 2x10 bilat  DATE: 09/07/2022  Reviewed 3 HEP exercises to complete in AM - supine hamstring with strap, LTR, and seated childs pose   Pt seen for aquatic therapy today.  Treatment took  place in water 3.5-4.75 ft in depth at the [facility] pool. Temp of water was 91.  Pt entered/exited the pool via stairs independently with bilat rail. * unsupported:  walking forward/ backward and  side stepping without/ with arm abdct/ addct * high knee marching forward/ backward with arm swing * wood chop with small yellow ball 2 x 5 * walking LE circumduction forward/backward with mod cues for technique (UE On noodle) * Lt runners step ups and Rt retro slow step down x 10 with UE on rails - 1st  step - then 5 steps up with less submersion x 2 * Rt forward step downs, Lt retro step ups x 8 with BUEon rails * UE on yellow noodle:  hip abdct/ addct x 10 and leg swings into hip flex/ ext - multiple reps * L/R/L quad stretch with foot on2nd step behind him   *Lt lateral step up x 5 with UE on rails  DATE: 09/04/2022  Recumbent bike level 2.0 x7 min with PT present to discuss status Standing hamstring stretch at stairs 2x20 sec bilat Standing L stretch at counter 2x20 sec Seated sciatic nerve tensioner x10 bilat Prone "Y" 2x10 bilat Prone hamstring curl 2x10 bilat Prone press-up x10 Side-lying clamshell 2x10 bilat  PATIENT EDUCATION:  Education details: aquatic progressions/ modifications Person educated: Patient Education method: Programmer, multimedia, Demonstration Education comprehension: verbalized understanding and returned demonstration  HOME EXERCISE PROGRAM: Access Code: [email or web address] URL: [email or web address] Date: 09/04/2022 Prepared by: Clydie Braun Menke  Exercises - Supine Single Knee to Chest Stretch  - 1 x daily - 7 x weekly - 1 sets - 2 reps - 20 sec hold - Supine Lower Trunk Rotation  - 1 x daily - 7 x weekly - 1 sets - 10 reps - Supine Bridge  - 1 x daily - 7 x weekly - 2 sets - 10 reps - Supine Hamstring Stretch with Strap  - 1 x daily - 7 x weekly - 1 sets - 2 reps - 20 sec hold - Supine Piriformis Stretch with Foot on Ground  - 1 x daily - 7 x weekly - 1 sets - 2 reps - 20 sec hold - Supine March with Posterior Pelvic Tilt  - 1 x daily - 7 x weekly - 2 sets - 10 reps - Clamshell  - 1 x daily - 7 x weekly - 1-2 sets - 10 reps - Prone Knee Flexion  - 1 x daily - 7 x weekly - 1 sets - 10 reps - Prone Scapular Retraction Y  - 1 x daily - 7 x weekly - 2 sets - 10 reps - Prone Press Up  - 1 x daily - 7 x weekly - 2 sets - 10 reps - Cat Cow  - 1 x daily - 7 x weekly - 2 sets - 10 reps - Standing 'L' Stretch at Counter  - 1 x daily - 7 x weekly - 1 sets - 2 reps -  20 sec hold - Seated Sciatic Tensioner  - 1 x daily - 7 x weekly - 2 sets - 10 reps - 1 sec hold  ASSESSMENT:  CLINICAL IMPRESSION: Cyril Loosen "Nadine Counts" reports significant improvement since starting PT. He was able to stop his Naproxen and Gabapentin and hasn't had pain for two days. Able to progress HS curls with weight today. He reported some discomfort in LB but able to abolish with core contraction. If pain remains reduced, he will benefit from further progression of TE.  OBJECTIVE IMPAIRMENTS: decreased balance, difficulty walking, decreased strength, increased muscle spasms, impaired flexibility, postural dysfunction, and pain.   ACTIVITY LIMITATIONS: lifting, bending, and squatting  PARTICIPATION LIMITATIONS: driving and community activity  PERSONAL FACTORS: Time since onset of injury/illness/exacerbation are also affecting patient's functional outcome.   REHAB POTENTIAL: Good  CLINICAL DECISION MAKING: Stable/uncomplicated  EVALUATION COMPLEXITY: Low   GOALS: Goals reviewed with patient? Yes  SHORT TERM GOALS: Target date: 09/07/2022  Patient will be independent with initial HEP. Baseline: Goal status: MET  2.  Patient will report at least a 40% improvement in symptoms during daily activities. Baseline:  Goal status: IN PROGRESS   LONG TERM GOALS: Target date: 10/12/2022  Patient will be independent with advanced HEP. Baseline:  Goal status: IN PROGRESS  2.  Patient will increase FOTO to at least 66% to demonstrate improvements in functional mobility. Baseline: 45% Goal status: INITIAL  3.  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 Baseline:  Goal status: INITIAL  4.  Patient will report ability to return to golf with no increase in pain. Baseline:  Goal status: INITIAL  5.  Patient will improve functional balance and not have any instances of losing balance or near  loss of balance in 2 weeks leading up to discharge. Baseline:  Goal status: INITIAL   PLAN:  PT FREQUENCY: 2x/week  PT DURATION: 8 weeks  PLANNED INTERVENTIONS: Therapeutic exercises, Therapeutic activity, Neuromuscular re-education, Balance training, Gait training, Patient/Family education, Self Care, Joint mobilization, Joint manipulation, Stair training, Aquatic Therapy, Dry Needling, Electrical stimulation, Spinal manipulation, Spinal mobilization, Cryotherapy, Moist heat, Taping, Traction, Ultrasound, Ionotophoresis 4mg /ml Dexamethasone, Manual therapy, and Re-evaluation.  PLAN FOR NEXT SESSION: Assess and progress HEP as indicated, core stability, flexibility, strengthening, aquatic PT   Solon Palm, PT  09/13/22 1:57 PM [facility] [address] [place], [place], [address] Phone: [phone]   Fax:  [phone]

## 2022-09-13 ENCOUNTER — Ambulatory Visit: Payer: Medicare HMO | Attending: Sports Medicine | Admitting: Physical Therapy

## 2022-09-13 ENCOUNTER — Encounter: Payer: Self-pay | Admitting: Physical Therapy

## 2022-09-13 DIAGNOSIS — M5459 Other low back pain: Secondary | ICD-10-CM | POA: Insufficient documentation

## 2022-09-13 DIAGNOSIS — M6281 Muscle weakness (generalized): Secondary | ICD-10-CM | POA: Insufficient documentation

## 2022-09-13 DIAGNOSIS — R2689 Other abnormalities of gait and mobility: Secondary | ICD-10-CM | POA: Diagnosis not present

## 2022-09-13 DIAGNOSIS — R252 Cramp and spasm: Secondary | ICD-10-CM | POA: Diagnosis not present

## 2022-09-13 DIAGNOSIS — R262 Difficulty in walking, not elsewhere classified: Secondary | ICD-10-CM | POA: Insufficient documentation

## 2022-09-18 ENCOUNTER — Ambulatory Visit (HOSPITAL_BASED_OUTPATIENT_CLINIC_OR_DEPARTMENT_OTHER): Payer: Medicare HMO | Attending: Sports Medicine | Admitting: Physical Therapy

## 2022-09-18 ENCOUNTER — Encounter (HOSPITAL_BASED_OUTPATIENT_CLINIC_OR_DEPARTMENT_OTHER): Payer: Self-pay | Admitting: Physical Therapy

## 2022-09-18 ENCOUNTER — Other Ambulatory Visit: Payer: Self-pay | Admitting: Neurology

## 2022-09-18 ENCOUNTER — Encounter (HOSPITAL_COMMUNITY)
Admission: RE | Admit: 2022-09-18 | Discharge: 2022-09-18 | Disposition: A | Discharge status: Home / Self Care | Payer: Medicare HMO | Source: Ambulatory Visit | Attending: Neurology | Admitting: Neurology

## 2022-09-18 DIAGNOSIS — R413 Other amnesia: Secondary | ICD-10-CM | POA: Insufficient documentation

## 2022-09-18 DIAGNOSIS — M545 Low back pain, unspecified: Secondary | ICD-10-CM | POA: Diagnosis not present

## 2022-09-18 DIAGNOSIS — R262 Difficulty in walking, not elsewhere classified: Secondary | ICD-10-CM | POA: Diagnosis not present

## 2022-09-18 DIAGNOSIS — R2689 Other abnormalities of gait and mobility: Secondary | ICD-10-CM

## 2022-09-18 DIAGNOSIS — M5459 Other low back pain: Secondary | ICD-10-CM

## 2022-09-18 DIAGNOSIS — R252 Cramp and spasm: Secondary | ICD-10-CM

## 2022-09-18 DIAGNOSIS — G3184 Mild cognitive impairment, so stated: Secondary | ICD-10-CM

## 2022-09-18 DIAGNOSIS — M6281 Muscle weakness (generalized): Secondary | ICD-10-CM

## 2022-09-18 DIAGNOSIS — R9089 Other abnormal findings on diagnostic imaging of central nervous system: Secondary | ICD-10-CM | POA: Diagnosis not present

## 2022-09-18 MED ORDER — FLORBETAPIR F 18 500-1900 MBQ/ML IV SOLN
10.3000 | Freq: Once | INTRAVENOUS | Status: AC
  Administered 2022-09-18: 10.3 via INTRAVENOUS

## 2022-09-18 NOTE — Therapy (Signed)
OUTPATIENT PHYSICAL THERAPY TREATMENT NOTE   Patient Name: Evan [name] MRN: [id number] DOB:Apr 30, 1950, 72 y.o., male Today's Date: 09/18/2022  END OF SESSION:  PT End of Session - 09/18/22 0915     Visit Number 8    Date for PT Re-Evaluation 10/12/22    Authorization Type [employer]    Progress Note Due on Visit 10    PT Start Time 0902    PT Stop Time 0945    PT Time Calculation (min) 43 min    Activity Tolerance Patient tolerated treatment well;No increased pain    Behavior During Therapy WFL for tasks assessed/performed              Past Medical History:  Diagnosis Date   Aortic atherosclerosis (HCC)    CAD (coronary artery disease)    Diverticulosis    Emphysema lung (HCC)    Hypercholesteremia    Hyperlipemia    Hypertension    Memory changes    Smoker    Tinnitus of both ears    Past Surgical History:  Procedure Laterality Date   HAND SURGERY     multiple reconstructive surgeries    HEMORRHOID SURGERY  01/23/11   HEMORRHOID SURGERY     HERNIA REPAIR     LEG SURGERY     Patient Active Problem List   Diagnosis Date Noted   Memory loss 08/09/2022   Sciatica 08/07/2022   Coronary artery disease involving native coronary artery of native heart without angina pectoris 07/12/2021   Former smoker 06/08/2016   Atypical chest pain 06/08/2016   Pure hypercholesterolemia 06/08/2016   Family history of early CAD 06/08/2016   RBBB 06/08/2016    PCP: Darrow Bussing, MD  REFERRING PROVIDER: Delfin Gant, MD  REFERRING DIAG: M54.50 (ICD-10-CM) - Low back pain, unspecified  Rationale for Evaluation and Treatment: Rehabilitation  THERAPY DIAG:  Other low back pain  Cramp and spasm  Other abnormalities of gait and mobility  Muscle weakness (generalized)  ONSET DATE: 07/14/2022  SUBJECTIVE:                                                                                                                                                                                            SUBJECTIVE STATEMENT: Pt reports that he hasn't taken gabapentin in over a week, and he takes naproxen as needed.  He reports he did his exercises before bed, so he woke up a little sore this morning (0.5/10) and took pain med shortly after waking.  He plans to use community pool or [facility] to continue aquatic exercises (HEP).   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 Yes: NPRS scale: currently 0.5/10 ( took pain medication in AM) Pain location: Lt lateral hip and Lt knee  Pain description: sore pain Aggravating factors: driving for prolonged periods Relieving factors: medication  PRECAUTIONS: Fall  WEIGHT BEARING RESTRICTIONS: No  FALLS:  Has patient fallen in last 6 months? Yes. Number of falls 2 falls in past month (reports left knee went out on him).  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  NEXT MD VISIT: as needed with Dr Penni Bombard  OBJECTIVE:   DIAGNOSTIC FINDINGS:  Lumbar Radiograph with Dr Penni Bombard unremarkable per patient  PATIENT SURVEYS:  Eval:  FOTO 45% (projected 66% by visit 10)  SCREENING FOR RED FLAGS: Bowel or bladder incontinence: No Spinal tumors: No Cauda equina syndrome: No Compression fracture: No Abdominal aneurysm: No  COGNITION: Overall cognitive status: Within functional limits for tasks assessed.  Does state that he has noted some memory changes, so he is working with a Insurance account manager to further assess.   SENSATION: Denies numbness or tingling, but does have some  radicular pain down LLE.  MUSCLE LENGTH: Hamstrings: tightness noted  POSTURE: rounded shoulders and forward head  PALPATION: Some spasms noted in lumbar multifidi  LUMBAR ROM:   WFL  LOWER EXTREMITY ROM:     WFL  LOWER EXTREMITY MMT:    08/21/2022: Right leg strength is WFL Left hip strength is 4/5, left quad strength is 4/5, left hamstring strength is 4+/5  LUMBAR SPECIAL TESTS:  Slump test: slight positive on left side  FUNCTIONAL TESTS:  Eval: 5 times sit to stand: 13.90 sec with UE sec with decreased weight bearing through LLE  GAIT: Distance walked: >50 ft Assistive device utilized: None Level of assistance: Complete Independence Comments: Patient reports that he has been wearing the left knee brace secondary to him feeling that at times his left leg may give out on him.  TODAY'S TREATMENT:                                                                                                                              Pt seen for aquatic therapy today.  Treatment took place in water 3.5-4.75 ft in depth at the [facility] pool. Temp of water was 91.  Pt entered/exited the pool via stairs independently with bilat rail. * unsupported:  walking forward/ backward * holding solid noodle under water on one side, walking forward/backward for increased core engagement * high knee marching forward/ backward with arm swing * TrA set with solid noodle pull down  * wood chop  with small yellow ball 2 x 10 * UE on solid noodle:  leg swings into hip abdct/ addct (crossing midline), hip flex/ext * side step into squat L/R   * Lt runners step ups and Rt retro slow step down x 10 with UE on rails  * Rt forward step downs, Lt retro step ups x 10 with BUEon rails * Lt lateral step ups x 10 * straddling noodle, cycling with breast stroke arms  * Lt quad stretch with ankle on noodle * L/R 3 way leg stretch with ankle on noodle  * issued laminated HEP and verbally reviewed    DATE: 09/13/2022  Recumbent bike level 2.0 x5 min with PT present to discuss status Standing hamstring stretch at stairs 2x20 sec bilat Standing L stretch at counter 2x20 sec Seated sciatic nerve tensioner x10 bilat Prone "Y" 2x10 bilat Prone hamstring curl 3# 2x10 bilat could feel in low back until performed pelvic press toward mat Prone press-up x15 Side-lying clamshell 2x10 bilat  DATE: 09/07/2022  Reviewed 3 HEP exercises to complete in AM - supine hamstring with strap, LTR, and seated childs pose   Pt seen for aquatic therapy today.  Treatment took place in water 3.5-4.75 ft in depth at the [facility] pool. Temp of water was 91.  Pt entered/exited the pool via stairs independently with bilat rail. * unsupported:  walking forward/ backward and  side stepping without/ with arm abdct/ addct * high knee marching forward/ backward with arm swing * wood chop with small yellow ball 2 x 5 * walking LE circumduction forward/backward with mod cues for technique (UE On noodle) * Lt runners step ups and Rt retro slow step down x 10 with UE on rails - 1st step - then 5 steps up with less submersion x 2 * Rt forward step downs, Lt retro step ups x 8 with BUEon rails * UE on yellow noodle:  hip abdct/ addct x 10 and leg swings into hip flex/ ext - multiple reps * L/R/L quad stretch with foot on2nd step behind him   *Lt lateral step up x 5 with UE on rails  DATE: 09/04/2022  Recumbent bike level 2.0 x7 min with PT present to discuss status Standing hamstring stretch at stairs 2x20 sec bilat Standing L stretch at counter 2x20 sec Seated sciatic nerve tensioner x10 bilat Prone "Y" 2x10 bilat Prone hamstring curl 2x10 bilat Prone press-up x10 Side-lying clamshell 2x10 bilat  PATIENT EDUCATION:  Education details: aquatic progressions/ modifications Person educated: Patient Education method: Programmer, multimedia, Demonstration Education comprehension: verbalized understanding and returned  demonstration  HOME EXERCISE PROGRAM: Access Code: [email or web address] URL: [email or web address] Date: 09/04/2022 Prepared by: Clydie Braun Menke  Exercises - Supine Single Knee to Chest Stretch  - 1 x daily - 7 x weekly - 1 sets - 2 reps - 20 sec hold - Supine Lower Trunk Rotation  - 1 x daily - 7 x weekly - 1 sets - 10 reps - Supine Bridge  - 1 x daily - 7 x weekly - 2 sets - 10 reps - Supine Hamstring Stretch with Strap  - 1 x daily - 7 x weekly - 1 sets - 2 reps - 20 sec hold - Supine Piriformis Stretch with Foot on Ground  - 1 x daily - 7 x weekly - 1 sets - 2 reps - 20 sec hold - Supine March with Posterior Pelvic Tilt  - 1 x daily - 7 x weekly - 2 sets - 10 reps -  Clamshell  - 1 x daily - 7 x weekly - 1-2 sets - 10 reps - Prone Knee Flexion  - 1 x daily - 7 x weekly - 1 sets - 10 reps - Prone Scapular Retraction Y  - 1 x daily - 7 x weekly - 2 sets - 10 reps - Prone Press Up  - 1 x daily - 7 x weekly - 2 sets - 10 reps - Cat Cow  - 1 x daily - 7 x weekly - 2 sets - 10 reps - Standing 'L' Stretch at Counter  - 1 x daily - 7 x weekly - 1 sets - 2 reps - 20 sec hold - Seated Sciatic Tensioner  - 1 x daily - 7 x weekly - 2 sets - 10 reps - 1 sec hold AQUATIC Access Code: [email or web address] URL: [email or web address] Date: 09/18/2022 Prepared by: [facility] - [facility] - [facility] This aquatic home exercise program from MedBridge utilizes pictures from land based exercises, but has been adapted prior to lamination and issuance.   ASSESSMENT:  CLINICAL IMPRESSION: Pt completed aquatic therapy exercises without any pain.  He was challenged with Rt forward step downs and SLS exercises with dynamic opp leg.  Issued laminated HEP today; pt verbalized readiness to d/c from aquatics.  Pt is progressing well towards remaining goals.    OBJECTIVE IMPAIRMENTS: decreased balance, difficulty walking, decreased strength, increased muscle spasms, impaired flexibility, postural  dysfunction, and pain.   ACTIVITY LIMITATIONS: lifting, bending, and squatting  PARTICIPATION LIMITATIONS: driving and community activity  PERSONAL FACTORS: Time since onset of injury/illness/exacerbation are also affecting patient's functional outcome.   REHAB POTENTIAL: Good  CLINICAL DECISION MAKING: Stable/uncomplicated  EVALUATION COMPLEXITY: Low   GOALS: Goals reviewed with patient? Yes  SHORT TERM GOALS: Target date: 09/07/2022  Patient will be independent with initial HEP. Baseline: Goal status: MET  2.  Patient will report at least a 40% improvement in symptoms during daily activities. Baseline:  Goal status: IN PROGRESS   LONG TERM GOALS: Target date: 10/12/2022  Patient will be independent with advanced HEP. Baseline:  Goal status: IN PROGRESS  2.  Patient will increase FOTO to at least 66% to demonstrate improvements in functional mobility. Baseline: 45% Goal status: INITIAL  3.  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 Baseline:  Goal status: INITIAL  4.  Patient will report ability to return to golf with no increase in pain. Baseline:  Goal status: INITIAL  5.  Patient will improve functional balance and not have any instances of losing balance or near loss of balance in 2 weeks leading up to discharge. Baseline:  Goal status: INITIAL   PLAN:  PT FREQUENCY: 2x/week  PT DURATION: 8 weeks  PLANNED INTERVENTIONS: Therapeutic exercises, Therapeutic activity, Neuromuscular re-education, Balance training, Gait training, Patient/Family education, Self Care, Joint mobilization, Joint manipulation, Stair training, Aquatic Therapy, Dry Needling, Electrical stimulation, Spinal manipulation, Spinal mobilization, Cryotherapy, Moist heat, Taping, Traction, Ultrasound, Ionotophoresis 4mg /ml Dexamethasone, Manual therapy, and Re-evaluation.  PLAN FOR NEXT SESSION: Assess  and progress HEP as indicated, core stability, flexibility, strengthening  Mayer Camel, PTA 09/18/22 10:05 AM [facility] [address] [place], [place], [address] Phone: [phone]   Fax:  [phone]

## 2022-09-19 ENCOUNTER — Telehealth: Payer: Self-pay | Admitting: Neurology

## 2022-09-19 ENCOUNTER — Ambulatory Visit
Admission: RE | Admit: 2022-09-19 | Discharge: 2022-09-19 | Disposition: A | Discharge status: Home / Self Care | Payer: Medicare HMO | Source: Ambulatory Visit

## 2022-09-19 ENCOUNTER — Other Ambulatory Visit (INDEPENDENT_AMBULATORY_CARE_PROVIDER_SITE_OTHER): Payer: Self-pay

## 2022-09-19 DIAGNOSIS — G3184 Mild cognitive impairment, so stated: Secondary | ICD-10-CM | POA: Diagnosis not present

## 2022-09-19 DIAGNOSIS — J439 Emphysema, unspecified: Secondary | ICD-10-CM | POA: Diagnosis not present

## 2022-09-19 DIAGNOSIS — Z0289 Encounter for other administrative examinations: Secondary | ICD-10-CM

## 2022-09-19 DIAGNOSIS — I700 Atherosclerosis of aorta: Secondary | ICD-10-CM | POA: Diagnosis not present

## 2022-09-19 DIAGNOSIS — Z87891 Personal history of nicotine dependence: Secondary | ICD-10-CM

## 2022-09-19 DIAGNOSIS — Z122 Encounter for screening for malignant neoplasm of respiratory organs: Secondary | ICD-10-CM

## 2022-09-19 NOTE — Telephone Encounter (Signed)
[employer] medicare sent to [facility] they obtain auth [id number] 

## 2022-09-20 ENCOUNTER — Other Ambulatory Visit: Payer: Self-pay | Admitting: Acute Care

## 2022-09-20 ENCOUNTER — Ambulatory Visit: Payer: Medicare HMO

## 2022-09-20 DIAGNOSIS — R2689 Other abnormalities of gait and mobility: Secondary | ICD-10-CM | POA: Diagnosis not present

## 2022-09-20 DIAGNOSIS — R252 Cramp and spasm: Secondary | ICD-10-CM

## 2022-09-20 DIAGNOSIS — R262 Difficulty in walking, not elsewhere classified: Secondary | ICD-10-CM | POA: Diagnosis not present

## 2022-09-20 DIAGNOSIS — M5459 Other low back pain: Secondary | ICD-10-CM | POA: Diagnosis not present

## 2022-09-20 DIAGNOSIS — M6281 Muscle weakness (generalized): Secondary | ICD-10-CM

## 2022-09-20 DIAGNOSIS — Z122 Encounter for screening for malignant neoplasm of respiratory organs: Secondary | ICD-10-CM

## 2022-09-20 DIAGNOSIS — Z87891 Personal history of nicotine dependence: Secondary | ICD-10-CM

## 2022-09-20 NOTE — Therapy (Signed)
OUTPATIENT PHYSICAL THERAPY TREATMENT NOTE   Patient Name: Evan [name] MRN: [id number] DOB:1950/07/04, 72 y.o., male Today's Date: 09/20/2022  END OF SESSION:  PT End of Session - 09/20/22 0931     Visit Number 9    Date for PT Re-Evaluation 10/12/22    Authorization Type [employer] Medicare    Progress Note Due on Visit 10    PT Start Time [phone]    PT Stop Time 1020    PT Time Calculation (min) 49 min    Activity Tolerance Patient tolerated treatment well;No increased pain    Behavior During Therapy WFL for tasks assessed/performed              Past Medical History:  Diagnosis Date   Aortic atherosclerosis (HCC)    CAD (coronary artery disease)    Diverticulosis    Emphysema lung (HCC)    Hypercholesteremia    Hyperlipemia    Hypertension    Memory changes    Smoker    Tinnitus of both ears    Past Surgical History:  Procedure Laterality Date   HAND SURGERY     multiple reconstructive surgeries    HEMORRHOID SURGERY  01/23/11   HEMORRHOID SURGERY     HERNIA REPAIR     LEG SURGERY     Patient Active Problem List   Diagnosis Date Noted   Memory loss 08/09/2022   Sciatica 08/07/2022   Coronary artery disease involving native coronary artery of native heart without angina pectoris 07/12/2021   Former smoker 06/08/2016   Atypical chest pain 06/08/2016   Pure hypercholesterolemia 06/08/2016   Family history of early CAD 06/08/2016   RBBB 06/08/2016    PCP: Darrow Bussing, MD  REFERRING PROVIDER: Delfin Gant, MD  REFERRING DIAG: M54.50 (ICD-10-CM) - Low back pain, unspecified  Rationale for Evaluation and Treatment: Rehabilitation  THERAPY DIAG:  Other low back pain  Cramp and spasm  Other abnormalities of gait and mobility  Muscle weakness (generalized)  Difficulty in walking, not elsewhere classified  ONSET DATE: 07/14/2022  SUBJECTIVE:                                                                                                                                                                                            SUBJECTIVE STATEMENT: Pt reports he has still not needed any Gabapentin and has not needed the Naproxen in the past 2 days.  He reports his pain at 0/10 but admits the left leg feels a little crampy at times.  "But no pain really"  PERTINENT HISTORY:  hypertension, hyperlipidemia, CAD, emphysema, traumatic left hand amputation at the  age of 3  PAIN:  09/20/22: Are you having pain? No   PRECAUTIONS: Fall  WEIGHT BEARING RESTRICTIONS: No  FALLS:  Has patient fallen in last 6 months? Yes. Number of falls 2 falls in past month (reports left knee went out on him).  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  NEXT MD VISIT: as needed with Dr Penni Bombard  OBJECTIVE:   DIAGNOSTIC FINDINGS:  Lumbar Radiograph with Dr Penni Bombard unremarkable per patient  PATIENT SURVEYS:  Eval:  FOTO 45% (projected 66% by visit 10)  SCREENING FOR RED FLAGS: Bowel or bladder incontinence: No Spinal tumors: No Cauda equina syndrome: No Compression fracture: No Abdominal aneurysm: No  COGNITION: Overall cognitive status: Within functional limits for tasks assessed.  Does state that he has noted some memory changes, so he is working with a Insurance account manager to further assess.   SENSATION: Denies numbness or tingling, but does have some radicular pain down LLE.  MUSCLE LENGTH: Hamstrings: tightness noted  POSTURE: rounded shoulders and forward head  PALPATION: Some spasms noted in lumbar multifidi  LUMBAR ROM:   WFL  LOWER EXTREMITY ROM:     WFL  LOWER EXTREMITY  MMT:    08/21/2022: Right leg strength is WFL Left hip strength is 4/5, left quad strength is 4/5, left hamstring strength is 4+/5  LUMBAR SPECIAL TESTS:  Slump test: slight positive on left side  FUNCTIONAL TESTS:  Eval: 5 times sit to stand: 13.90 sec with UE sec with decreased weight bearing through LLE  GAIT: Distance walked: >50 ft Assistive device utilized: None Level of assistance: Complete Independence Comments: Patient reports that he has been wearing the left knee brace secondary to him feeling that at times his left leg may give out on him.  TODAY'S TREATMENT:                                                                                                                              DATE: 09/20/2022  Nustep level 5 x 5 min with PT present to discuss status Standing hamstring stretch 3 x 30 sec Standing quad stretch 3 x 30 sec Seated ball roll outs x 10 each direction fwd, left and right holding 4-5 sec each Prone "Y" 2x10 bilat Prone press-up x15 Prone hamstring curl 3# 2x10 bilat could feel in low back until performed pelvic press toward mat Side-lying clamshell 2x10 bilat  09/18/22: Pt seen for aquatic therapy today.  Treatment took place in water 3.5-4.75 ft in depth at the [facility] pool. Temp of water was 91.  Pt entered/exited the pool via stairs independently with bilat rail. * unsupported:  walking forward/  backward * holding solid noodle under water on one side, walking forward/backward for increased core engagement * high knee marching forward/ backward with arm swing * TrA set with solid noodle pull down  * wood chop with small yellow ball 2 x 10 * UE on solid noodle:  leg swings into hip abdct/ addct (crossing midline), hip flex/ext * side step into squat L/R   * Lt runners step ups and Rt retro slow step down x 10 with UE on rails  * Rt forward step downs, Lt retro step ups x 10 with BUEon rails * Lt lateral step ups x 10 * straddling noodle,  cycling with breast stroke arms  * Lt quad stretch with ankle on noodle * L/R 3 way leg stretch with ankle on noodle  * issued laminated HEP and verbally reviewed   DATE: 09/13/2022  Recumbent bike level 2.0 x5 min with PT present to discuss status Standing hamstring stretch at stairs 2x20 sec bilat Standing L stretch at counter 2x20 sec Seated sciatic nerve tensioner x10 bilat Prone "Y" 2x10 bilat Prone hamstring curl 3# 2x10 bilat could feel in low back until performed pelvic press toward mat Prone press-up x15 Side-lying clamshell 2x10 bilat  DATE: 09/07/2022  Reviewed 3 HEP exercises to complete in AM - supine hamstring with strap, LTR, and seated childs pose   Pt seen for aquatic therapy today.  Treatment took place in water 3.5-4.75 ft in depth at the [facility] pool. Temp of water was 91.  Pt entered/exited the pool via stairs independently with bilat rail. * unsupported:  walking forward/ backward and  side stepping without/ with arm abdct/ addct * high knee marching forward/ backward with arm swing * wood chop with small yellow ball 2 x 5 * walking LE circumduction forward/backward with mod cues for technique (UE On noodle) * Lt runners step ups and Rt retro slow step down x 10 with UE on rails - 1st step - then 5 steps up with less submersion x 2 * Rt forward step downs, Lt retro step ups x 8 with BUEon rails * UE on yellow noodle:  hip abdct/ addct x 10 and leg swings into hip flex/ ext - multiple reps * L/R/L quad stretch with foot on2nd step behind him   *Lt lateral step up x 5 with UE on rails   PATIENT EDUCATION:  Education details: aquatic progressions/ modifications Person educated: Patient Education method: Programmer, multimedia, Demonstration Education comprehension: verbalized understanding and returned demonstration  HOME EXERCISE PROGRAM: Access Code: [email or web address] URL: [email or web address] Date: 09/20/2022 Prepared by: Mikey Kirschner  Exercises - Supine Single Knee to Chest Stretch  - 1 x daily - 7 x weekly - 1 sets - 2 reps - 20 sec hold - Supine Lower Trunk Rotation  - 1 x daily - 7 x weekly - 1 sets - 10 reps - Supine Bridge  - 1 x daily - 7 x weekly - 2 sets - 10 reps - Supine Hamstring Stretch with Strap  - 1 x daily - 7 x weekly - 1 sets - 2 reps - 20 sec hold - Supine Piriformis Stretch with Foot on Ground  - 1 x daily - 7 x weekly - 1 sets - 2 reps - 20 sec hold - Supine March with Posterior Pelvic Tilt  - 1 x daily - 7 x weekly - 2 sets - 10 reps - Clamshell  - 1 x daily - 7 x weekly - 1-2 sets - 10  reps - Prone Knee Flexion  - 1 x daily - 7 x weekly - 1 sets - 10 reps - Prone Scapular Retraction Y  - 1 x daily - 7 x weekly - 2 sets - 10 reps - Prone Press Up  - 1 x daily - 7 x weekly - 2 sets - 10 reps - Cat Cow  - 1 x daily - 7 x weekly - 2 sets - 10 reps - Standing 'L' Stretch at Counter  - 1 x daily - 7 x weekly - 1 sets - 2 reps - 20 sec hold - Seated Sciatic Tensioner  - 1 x daily - 7 x weekly - 2 sets - 10 reps - 1 sec hold - Standing Hamstring Stretch on Chair  - 1 x daily - 7 x weekly - 1 sets - 3 reps - 30 sec hold - Quadricep Stretch with Chair and Counter Support  - 1 x daily - 7 x weekly - 1 sets - 3 reps - 30 sec hold AQUATIC Access Code: [email or web address] URL: [email or web address] Date: 09/18/2022 Prepared by: [facility] - Outpatient Re[facility] - [facility] This aquatic home exercise program from [employer] utilizes pictures from land based exercises, but has been adapted prior to lamination and issuance.   ASSESSMENT:  CLINICAL IMPRESSION: Nadine Counts was able to tolerate addition of standing hamstring and quad stretches. He had no problems with hamstring curls with toes off edge of bed.  He is progressing appropriately.  We added the standing hamstring and quad stretches for option to do on job site when he goes back to work for [employer]     OBJECTIVE IMPAIRMENTS: decreased balance,  difficulty walking, decreased strength, increased muscle spasms, impaired flexibility, postural dysfunction, and pain.   ACTIVITY LIMITATIONS: lifting, bending, and squatting  PARTICIPATION LIMITATIONS: driving and community activity  PERSONAL FACTORS: Time since onset of injury/illness/exacerbation are also affecting patient's functional outcome.   REHAB POTENTIAL: Good  CLINICAL DECISION MAKING: Stable/uncomplicated  EVALUATION COMPLEXITY: Low   GOALS: Goals reviewed with patient? Yes  SHORT TERM GOALS: Target date: 09/07/2022  Patient will be independent with initial HEP. Baseline: Goal status: MET  2.  Patient will report at least a 40% improvement in symptoms during daily activities. Baseline:  Goal status: MET 09/20/22   LONG TERM GOALS: Target date: 10/12/2022  Patient will be independent with advanced HEP. Baseline:  Goal status: IN PROGRESS  2.  Patient will increase FOTO to at least 66% to demonstrate improvements in functional mobility. Baseline: 45% Goal status: INITIAL  3.  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 Baseline:  Goal status: INITIAL  4.  Patient will report ability to return to golf with no increase in pain. Baseline:  Goal status: INITIAL  5.  Patient will improve functional balance and not have any instances of losing balance or near loss of balance in 2 weeks leading up to discharge. Baseline:  Goal status: INITIAL   PLAN:  PT FREQUENCY: 2x/week  PT DURATION: 8 weeks  PLANNED INTERVENTIONS: Therapeutic exercises, Therapeutic activity, Neuromuscular re-education, Balance training, Gait training, Patient/Family education, Self Care, Joint mobilization, Joint manipulation, Stair training, Aquatic Therapy, Dry Needling, Electrical stimulation, Spinal manipulation, Spinal mobilization, Cryotherapy, Moist heat, Taping, Traction, Ultrasound,  Ionotophoresis 4mg /ml Dexamethasone, Manual therapy, and Re-evaluation.  PLAN FOR NEXT SESSION: Progress core stability, flexibility, strengthening, assess response to added standing stretches.   Victorino Dike B. Insiya Oshea, PT 09/20/22  10:28 AM  [facility] [address] [place], [place] [address] Phone # [phone] Fax [phone]

## 2022-09-21 ENCOUNTER — Encounter: Payer: Self-pay | Admitting: Neurology

## 2022-09-24 ENCOUNTER — Ambulatory Visit
Admission: RE | Admit: 2022-09-24 | Discharge: 2022-09-24 | Disposition: A | Discharge status: Home / Self Care | Payer: Medicare HMO | Source: Ambulatory Visit | Attending: Neurology | Admitting: Neurology

## 2022-09-24 DIAGNOSIS — G3184 Mild cognitive impairment, so stated: Secondary | ICD-10-CM

## 2022-09-24 NOTE — Therapy (Signed)
OUTPATIENT PHYSICAL THERAPY TREATMENT NOTE   Patient Name: Evan [name] MRN: [id number] DOB:Mar 18, 1951, 72 y.o., male Today's Date: 09/20/2022  END OF SESSION:  PT End of Session - 09/20/22 0931     Visit Number 9    Date for PT Re-Evaluation 10/12/22    Authorization Type [employer]    Progress Note Due on Visit 10    PT Start Time [phone]    PT Stop Time 1020    PT Time Calculation (min) 49 min    Activity Tolerance Patient tolerated treatment well;No increased pain    Behavior During Therapy WFL for tasks assessed/performed              Past Medical History:  Diagnosis Date   Aortic atherosclerosis (HCC)    CAD (coronary artery disease)    Diverticulosis    Emphysema lung (HCC)    Hypercholesteremia    Hyperlipemia    Hypertension    Memory changes    Smoker    Tinnitus of both ears    Past Surgical History:  Procedure Laterality Date   HAND SURGERY     multiple reconstructive surgeries    HEMORRHOID SURGERY  01/23/11   HEMORRHOID SURGERY     HERNIA REPAIR     LEG SURGERY     Patient Active Problem List   Diagnosis Date Noted   Memory loss 08/09/2022   Sciatica 08/07/2022   Coronary artery disease involving native coronary artery of native heart without angina pectoris 07/12/2021   Former smoker 06/08/2016   Atypical chest pain 06/08/2016   Pure hypercholesterolemia 06/08/2016   Family history of early CAD 06/08/2016   RBBB 06/08/2016    PCP: Darrow Bussing, MD  REFERRING PROVIDER: Delfin Gant, MD  REFERRING DIAG: M54.50 (ICD-10-CM) - Low back pain, unspecified  Rationale for Evaluation and Treatment: Rehabilitation  THERAPY DIAG:  Other low back pain  Cramp and spasm  Other abnormalities of gait and mobility  Muscle weakness (generalized)  Difficulty in walking, not elsewhere classified  ONSET DATE: 07/14/2022  SUBJECTIVE:                                                                                                                                                                                            SUBJECTIVE STATEMENT: ***  PERTINENT HISTORY:  hypertension, hyperlipidemia, CAD, emphysema, 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 No   PRECAUTIONS: Fall  WEIGHT BEARING RESTRICTIONS: No  FALLS:  Has patient fallen in last 6 months? Yes. Number of falls 2 falls in past month (reports left  knee went out on him).  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  NEXT MD VISIT: as needed with Dr Penni Bombard  OBJECTIVE:   DIAGNOSTIC FINDINGS:  Lumbar Radiograph with Dr Penni Bombard unremarkable per patient  PATIENT SURVEYS:  Eval:  FOTO 45% (projected 66% by visit 10)  SCREENING FOR RED FLAGS: Bowel or bladder incontinence: No Spinal tumors: No Cauda equina syndrome: No Compression fracture: No Abdominal aneurysm: No  COGNITION: Overall cognitive status: Within functional limits for tasks assessed.  Does state that he has noted some memory changes, so he is working with a Insurance account manager to further assess.   SENSATION: Denies numbness or tingling, but does have some radicular pain down LLE.  MUSCLE LENGTH: Hamstrings: tightness noted  POSTURE: rounded shoulders and forward head  PALPATION: Some spasms noted in lumbar multifidi  LUMBAR ROM:   WFL  LOWER EXTREMITY ROM:     WFL  LOWER EXTREMITY MMT:    08/21/2022: Right leg strength is WFL Left hip strength is 4/5, left quad strength is 4/5, left hamstring strength is 4+/5  LUMBAR SPECIAL TESTS:  Slump test: slight positive on left  side  FUNCTIONAL TESTS:  Eval: 5 times sit to stand: 13.90 sec with UE sec with decreased weight bearing through LLE  GAIT: Distance walked: >50 ft Assistive device utilized: None Level of assistance: Complete Independence Comments: Patient reports that he has been wearing the left knee brace secondary to him feeling that at times his left leg may give out on him.  TODAY'S TREATMENT:                                                                                                                              DATE: 09/25/2022  Nustep level 5 x 5 min with PT present to discuss status Standing hamstring stretch 3 x 30 sec Standing quad stretch 3 x 30 sec Seated ball roll outs x 10 each direction fwd, left and right holding 4-5 sec each Prone "Y" 2x10 bilat Prone press-up x15 Prone hamstring curl 3# 2x10 bilat could feel in low back until performed pelvic press toward mat Side-lying clamshell 2x10 bilat  DATE: 09/20/2022  Nustep level 5 x 5 min with PT present to discuss status Standing hamstring stretch 3 x 30 sec Standing quad stretch 3 x 30 sec Seated ball roll outs x 10 each direction fwd, left and right holding 4-5 sec each Prone "Y" 2x10 bilat Prone press-up x15 Prone hamstring curl 3# 2x10 bilat could feel in low back until performed pelvic press toward mat Side-lying clamshell 2x10 bilat  09/18/22: Pt seen for aquatic therapy today.  Treatment took place  in water 3.5-4.75 ft in depth at the [facility] pool. Temp of water was 91.  Pt entered/exited the pool via stairs independently with bilat rail. * unsupported:  walking forward/ backward * holding solid noodle under water on one side, walking forward/backward for increased core engagement * high knee marching forward/ backward with arm swing * TrA set with solid noodle pull down  * wood chop with small yellow ball 2 x 10 * UE on solid noodle:  leg swings into hip abdct/ addct (crossing midline), hip flex/ext * side  step into squat L/R   * Lt runners step ups and Rt retro slow step down x 10 with UE on rails  * Rt forward step downs, Lt retro step ups x 10 with BUEon rails * Lt lateral step ups x 10 * straddling noodle, cycling with breast stroke arms  * Lt quad stretch with ankle on noodle * L/R 3 way leg stretch with ankle on noodle  * issued laminated HEP and verbally reviewed   DATE: 09/13/2022  Recumbent bike level 2.0 x5 min with PT present to discuss status Standing hamstring stretch at stairs 2x20 sec bilat Standing L stretch at counter 2x20 sec Seated sciatic nerve tensioner x10 bilat Prone "Y" 2x10 bilat Prone hamstring curl 3# 2x10 bilat could feel in low back until performed pelvic press toward mat Prone press-up x15 Side-lying clamshell 2x10 bilat  PATIENT EDUCATION:  Education details: aquatic progressions/ modifications Person educated: Patient Education method: Programmer, multimedia, Demonstration Education comprehension: verbalized understanding and returned demonstration  HOME EXERCISE PROGRAM: Access Code: [email or web address] URL: [email or web address] Date: 09/20/2022 Prepared by: Mikey Kirschner  Exercises - Supine Single Knee to Chest Stretch  - 1 x daily - 7 x weekly - 1 sets - 2 reps - 20 sec hold - Supine Lower Trunk Rotation  - 1 x daily - 7 x weekly - 1 sets - 10 reps - Supine Bridge  - 1 x daily - 7 x weekly - 2 sets - 10 reps - Supine Hamstring Stretch with Strap  - 1 x daily - 7 x weekly - 1 sets - 2 reps - 20 sec hold - Supine Piriformis Stretch with Foot on Ground  - 1 x daily - 7 x weekly - 1 sets - 2 reps - 20 sec hold - Supine March with Posterior Pelvic Tilt  - 1 x daily - 7 x weekly - 2 sets - 10 reps - Clamshell  - 1 x daily - 7 x weekly - 1-2 sets - 10 reps - Prone Knee Flexion  - 1 x daily - 7 x weekly - 1 sets - 10 reps - Prone Scapular Retraction Y  - 1 x daily - 7 x weekly - 2 sets - 10 reps - Prone Press Up  - 1 x daily - 7 x weekly - 2 sets - 10  reps - Cat Cow  - 1 x daily - 7 x weekly - 2 sets - 10 reps - Standing 'L' Stretch at Counter  - 1 x daily - 7 x weekly - 1 sets - 2 reps - 20 sec hold - Seated Sciatic Tensioner  - 1 x daily - 7 x weekly - 2 sets - 10 reps - 1 sec hold - Standing Hamstring Stretch on Chair  - 1 x daily - 7 x weekly - 1 sets - 3 reps - 30 sec hold - Quadricep Stretch with Chair and Counter Support  - 1 x daily -  7 x weekly - 1 sets - 3 reps - 30 sec hold AQUATIC Access Code: [email or web address] URL: [email or web address] Date: 09/18/2022 Prepared by: [facility] - Outpatient Rehab - [address] This aquatic home exercise program from Med[employer] utilizes pictures from land based exercises, but has been adapted prior to lamination and issuance.   ASSESSMENT:  CLINICAL IMPRESSION: ***   OBJECTIVE IMPAIRMENTS: decreased balance, difficulty walking, decreased strength, increased muscle spasms, impaired flexibility, postural dysfunction, and pain.   ACTIVITY LIMITATIONS: lifting, bending, and squatting  PARTICIPATION LIMITATIONS: driving and community activity  PERSONAL FACTORS: Time since onset of injury/illness/exacerbation are also affecting patient's functional outcome.   REHAB POTENTIAL: Good  CLINICAL DECISION MAKING: Stable/uncomplicated  EVALUATION COMPLEXITY: Low   GOALS: Goals reviewed with patient? Yes  SHORT TERM GOALS: Target date: 09/07/2022  Patient will be independent with initial HEP. Baseline: Goal status: MET  2.  Patient will report at least a 40% improvement in symptoms during daily activities. Baseline:  Goal status: MET 09/20/22   LONG TERM GOALS: Target date: 10/12/2022  Patient will be independent with advanced HEP. Baseline:  Goal status: IN PROGRESS  2.  Patient will increase FOTO to at least 66% to demonstrate improvements in functional mobility. Baseline: 45% Goal status: INITIAL  3.  Patient will increase left hip/quad strength to at least 5-/5 to allow  him to return to carrying heavy items for volunteer work. Baseline:  Goal status: INITIAL  4.  Patient will report ability to return to golf with no increase in pain. Baseline:  Goal status: INITIAL  5.  Patient will improve functional balance and not have any instances of losing balance or near loss of balance in 2 weeks leading up to discharge. Baseline:  Goal status: INITIAL   PLAN:  PT FREQUENCY: 2x/week  PT DURATION: 8 weeks  PLANNED INTERVENTIONS: Therapeutic exercises, Therapeutic activity, Neuromuscular re-education, Balance training, Gait training, Patient/Family education, Self Care, Joint mobilization, Joint manipulation, Stair training, Aquatic Therapy, Dry Needling, Electrical stimulation, Spinal manipulation, Spinal mobilization, Cryotherapy, Moist heat, Taping, Traction, Ultrasound, Ionotophoresis 4mg /ml Dexamethasone, Manual therapy, and Re-evaluation.  PLAN FOR NEXT SESSION: Progress core stability, flexibility, strengthening, assess response to added standing stretches.   Victorino Dike B. Fields, PT 09/20/22 10:28 AM  [facility] [address], [address] [place], [place] [address] Phone # [phone] Fax [phone]

## 2022-09-25 ENCOUNTER — Encounter: Payer: Self-pay | Admitting: Physical Therapy

## 2022-09-25 ENCOUNTER — Ambulatory Visit: Payer: Medicare HMO | Admitting: Physical Therapy

## 2022-09-25 DIAGNOSIS — R2689 Other abnormalities of gait and mobility: Secondary | ICD-10-CM

## 2022-09-25 DIAGNOSIS — M5459 Other low back pain: Secondary | ICD-10-CM | POA: Diagnosis not present

## 2022-09-25 DIAGNOSIS — M6281 Muscle weakness (generalized): Secondary | ICD-10-CM

## 2022-09-25 DIAGNOSIS — R252 Cramp and spasm: Secondary | ICD-10-CM

## 2022-09-25 DIAGNOSIS — R262 Difficulty in walking, not elsewhere classified: Secondary | ICD-10-CM | POA: Diagnosis not present

## 2022-09-26 ENCOUNTER — Encounter (HOSPITAL_BASED_OUTPATIENT_CLINIC_OR_DEPARTMENT_OTHER): Payer: Self-pay | Admitting: Physical Therapy

## 2022-09-28 ENCOUNTER — Ambulatory Visit: Payer: Medicare HMO | Admitting: Rehabilitative and Restorative Service Providers"

## 2022-09-28 ENCOUNTER — Encounter: Payer: Self-pay | Admitting: Rehabilitative and Restorative Service Providers"

## 2022-09-28 DIAGNOSIS — M5459 Other low back pain: Secondary | ICD-10-CM

## 2022-09-28 DIAGNOSIS — R262 Difficulty in walking, not elsewhere classified: Secondary | ICD-10-CM | POA: Diagnosis not present

## 2022-09-28 DIAGNOSIS — R2689 Other abnormalities of gait and mobility: Secondary | ICD-10-CM

## 2022-09-28 DIAGNOSIS — R252 Cramp and spasm: Secondary | ICD-10-CM | POA: Diagnosis not present

## 2022-09-28 DIAGNOSIS — M6281 Muscle weakness (generalized): Secondary | ICD-10-CM

## 2022-09-28 LAB — APOE ALZHEIMER'S RISK

## 2022-09-28 NOTE — Therapy (Signed)
OUTPATIENT PHYSICAL THERAPY TREATMENT NOTE    Patient Name: Evan [name] MRN: [id number] DOB:Feb 10, 1951, 72 y.o., male Today's Date: 09/28/2022  END OF SESSION:  PT End of Session - 09/28/22 0844     Visit Number 11    Date for PT Re-Evaluation 10/12/22    Authorization Type [employer] Medicare    Progress Note Due on Visit 20    PT Start Time [phone]    PT Stop Time 0920    PT Time Calculation (min) 39 min    Activity Tolerance Patient tolerated treatment well    Behavior During Therapy [facility] for tasks assessed/performed              Past Medical History:  Diagnosis Date   Aortic atherosclerosis (HCC)    CAD (coronary artery disease)    Diverticulosis    Emphysema lung (HCC)    Hypercholesteremia    Hyperlipemia    Hypertension    Memory changes    Smoker    Tinnitus of both ears    Past Surgical History:  Procedure Laterality Date   HAND SURGERY     multiple reconstructive surgeries    HEMORRHOID SURGERY  01/23/11   HEMORRHOID SURGERY     HERNIA REPAIR     LEG SURGERY     Patient Active Problem List   Diagnosis Date Noted   Memory loss 08/09/2022   Sciatica 08/07/2022   Coronary artery disease involving native coronary artery of native heart without angina pectoris 07/12/2021   Former smoker 06/08/2016   Atypical chest pain 06/08/2016   Pure hypercholesterolemia 06/08/2016   Family history of early CAD 06/08/2016   RBBB 06/08/2016    PCP: Darrow Bussing, MD  REFERRING PROVIDER: Delfin Gant, MD  REFERRING DIAG: M54.50 (ICD-10-CM) - Low back pain, unspecified  Rationale for Evaluation and Treatment: Rehabilitation  THERAPY DIAG:  Other low back pain  Cramp and spasm  Other abnormalities of gait and mobility  Muscle weakness (generalized)  Difficulty in walking, not elsewhere classified  ONSET DATE: 07/14/2022  SUBJECTIVE:                                                                                                                                                                                            SUBJECTIVE STATEMENT: Pt reports that he has not started [employer] work yet, but denies any pain when he returned to golf.  States 90% improvement since starting PT.  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 Yes NPRS scale: 1-2/10  Pain location: low back Pain description:  soreness   Aggravating factors: sitting in some chairs Relieving factors: movement/stretches    PRECAUTIONS: Fall  WEIGHT BEARING RESTRICTIONS: No  FALLS:  Has patient fallen in last 6 months? Yes. Number of falls 2 falls in past month (reports left knee went out on him).  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  NEXT MD VISIT: as needed with Dr Penni Bombard  OBJECTIVE:   DIAGNOSTIC FINDINGS:  Lumbar Radiograph with Dr Penni Bombard unremarkable per patient  PATIENT SURVEYS:  Eval:  FOTO 45% (projected 66% by visit 10) 09/25/22  78%  SCREENING FOR RED FLAGS: Bowel or bladder incontinence: No Spinal tumors: No Cauda equina syndrome: No Compression fracture: No Abdominal aneurysm: No  COGNITION: Overall cognitive status: Within functional limits for tasks assessed.  Does state that he has noted some memory changes, so he is working with a Insurance account manager to further assess.   SENSATION: Denies numbness or tingling, but does have some radicular pain down LLE.  MUSCLE LENGTH: Hamstrings: tightness noted  POSTURE: rounded shoulders and forward head  PALPATION: Some  spasms noted in lumbar multifidi  LUMBAR ROM:   WFL  LOWER EXTREMITY ROM:     WFL  LOWER EXTREMITY MMT:    08/21/2022: Right leg strength is WFL Left hip strength is 4/5, left quad strength is 4/5, left hamstring strength is 4+/5 09/25/22 Left hip strength:  Flex 4+ ABD,5/5 Ext 5/5   Knee: Flex 5/5   Ext 4+/5   LUMBAR SPECIAL TESTS:  Slump test: slight positive on left side  FUNCTIONAL TESTS:  Eval: 5 times sit to stand: 13.90 sec with UE sec with decreased weight bearing through LLE  GAIT: Distance walked: >50 ft Assistive device utilized: None Level of assistance: Complete Independence Comments: Patient reports that he has been wearing the left knee brace secondary to him feeling that at times his left leg may give out on him.  TODAY'S TREATMENT:                                                                                                                               DATE: 09/28/2022  Nustep level 5 x 7 min with PT present to discuss status FWD step ups on 6" step without UE support x10 bilat Lateral step up, over and back x10 bilat  Standing hamstring stretch 2x20 sec bilat Standing rocker board x20 sec Leg Press (seat at 8) 95# 2x10 Seated on blue pball with unilateral UE support:  marching and LAQ 2x10 each bilat 4 way resisted gait with 10# cable pulley x3 each direction   DATE: 09/25/2022  Bike  L4 x 5 min with PT present to discuss status Standing hip flexor with RTB 2x10 B Seated hip flexor x 10 RTB L x 20 Seated LAQ with RTB x 10 then with GTB x 10 Seated piriformis stretch x 30 sec B Prone press ups x 5 Side-lying clamshell  with RTB x10 L only for demo LTR for stretch vs mobility x 20 sec B   DATE: 09/20/2022  Nustep level 5 x 5 min with PT present to discuss status Standing hamstring stretch 3 x 30 sec Standing quad stretch 3 x 30 sec Seated ball roll outs x 10 each direction fwd, left and right holding 4-5 sec each Prone "Y" 2x10 bilat Prone  press-up x15 Prone hamstring curl 3# 2x10 bilat could feel in low back until performed pelvic press toward mat Side-lying clamshell 2x10 bilat Supine march with post pelvic tilt  - reviewed engagment of TA    PATIENT EDUCATION:  Education details: aquatic progressions/ modifications Person educated: Patient Education method: Programmer, multimedia, Demonstration Education comprehension: verbalized understanding and returned demonstration  HOME EXERCISE PROGRAM: Access Code: [email or web address] URL: [email or web address] Date: 09/25/2022 Prepared by: Raynelle Fanning  Exercises - Seated Piriformis Stretch with Trunk Bend  - 2 x daily - 7 x weekly - 1 sets - 3 reps - 30-60 sec  hold - Supine Lower Trunk Rotation  - 1 x daily - 7 x weekly - 1 sets - 10 reps - Supine Hamstring Stretch with Strap  - 1 x daily - 7 x weekly - 1 sets - 2 reps - 20 sec hold - Supine March with Posterior Pelvic Tilt  - 1 x daily - 7 x weekly - 2 sets - 10 reps - Prone Scapular Retraction Y  - 1 x daily - 7 x weekly - 2 sets - 10 reps - Prone Press Up  - 1 x daily - 7 x weekly - 2 sets - 10 reps - Cat Cow  - 1 x daily - 7 x weekly - 2 sets - 10 reps - Standing 'L' Stretch at Counter  - 1 x daily - 7 x weekly - 1 sets - 2 reps - 20 sec hold - Seated Sciatic Tensioner  - 1 x daily - 7 x weekly - 2 sets - 10 reps - 1 sec hold - Standing Hamstring Stretch on Chair  - 1 x daily - 7 x weekly - 1 sets - 3 reps - 30 sec hold - Quadricep Stretch with Chair and Counter Support  - 1 x daily - 7 x weekly - 1 sets - 3 reps - 30 sec hold - Supine Bridge  - 1 x daily - 7 x weekly - 2 sets - 10 reps - Clamshell with Resistance  - 1 x daily - 3-4 x weekly - 1 sets - 10 reps - Marching with Resistance  - 1 x daily - 3 x weekly - 1-3 sets - 10 reps - Sitting Knee Extension with Resistance  - 1 x daily - 3 x weekly - 1-3 sets - 10 reps - 10 seconds hold  AQUATIC Access Code: [email or web address] URL: [email or web address] Date:  09/18/2022 Prepared by: [facility] - Outpatient Rehab - [address] This aquatic home exercise program from [employer] utilizes pictures from land based exercises, but has been adapted prior to lamination and issuance.   ASSESSMENT:  CLINICAL IMPRESSION: Evan [name] is progressing well and is on track for discharge from services.  Patient able to progress with using weight machines during  visit today.  He did start to experience some left hip pain at end of side stepping with 10# cable resistance.  Patient educated during hamstring stretching to not push the stretch too far into discomfort, only a 2-3/10 stretch range.  Patient verbalizes understanding.  Patient to go out of town next week to the beach and has HEP to complete.  Will reassess for progress at that time for potential discharge at that time.   OBJECTIVE IMPAIRMENTS: decreased balance, difficulty walking, decreased strength, increased muscle spasms, impaired flexibility, postural dysfunction, and pain.   ACTIVITY LIMITATIONS: lifting, bending, and squatting  PARTICIPATION LIMITATIONS: driving and community activity  PERSONAL FACTORS: Time since onset of injury/illness/exacerbation are also affecting patient's functional outcome.   REHAB POTENTIAL: Good  CLINICAL DECISION MAKING: Stable/uncomplicated  EVALUATION COMPLEXITY: Low   GOALS: Goals reviewed with patient? Yes  SHORT TERM GOALS: Target date: 09/07/2022  Patient will be independent with initial HEP. Baseline: Goal status: MET  2.  Patient will report at least a 40% improvement in symptoms during daily activities. Baseline:  Goal status: MET 09/20/22   LONG TERM GOALS: Target date: 10/12/2022  Patient will be independent with advanced HEP. Baseline:  Goal status: IN PROGRESS  2.  Patient will increase FOTO to at least 66% to demonstrate improvements in functional mobility. Baseline: 45% Goal status: MET  3.  Patient will increase left hip/quad strength  to at least 5-/5 to allow him to return to carrying heavy items for volunteer work. Baseline:  Goal status: IN PROGRESS  4.  Patient will report ability to return to golf with no increase in pain. Baseline:  Goal status: MET  5.  Patient will improve functional balance and not have any instances of losing balance or near loss of balance in 2 weeks leading up to discharge. Baseline:  Goal status: IN PROGRESS (denies any falls as of 09/28/2022)   PLAN:  PT FREQUENCY: 2x/week  PT DURATION: 8 weeks  PLANNED INTERVENTIONS: Therapeutic exercises, Therapeutic activity, Neuromuscular re-education, Balance training, Gait training, Patient/Family education, Self Care, Joint mobilization, Joint manipulation, Stair training, Aquatic Therapy, Dry Needling, Electrical stimulation, Spinal manipulation, Spinal mobilization, Cryotherapy, Moist heat, Taping, Traction, Ultrasound, Ionotophoresis 4mg /ml Dexamethasone, Manual therapy, and Re-evaluation.  PLAN FOR NEXT SESSION: Possible discharge next visit if no problems when he is on his beach vacation.    Reather Laurence, PT 09/28/22 9:42 AM   [facility] [address] [place], [place] [address] Phone # [phone] Fax [phone]

## 2022-10-01 ENCOUNTER — Telehealth: Payer: Self-pay

## 2022-10-01 NOTE — Telephone Encounter (Signed)
Patient returned the call, did the assessment with him over the phone and told him that he needs to also come in for a moca and he states he is out of town until the weekend and would come on Monday for [facility]

## 2022-10-01 NOTE — Telephone Encounter (Signed)
I called the patient and left a voicemail. He will need to answer a functional activity questionnaire to get [facility] approved. If the patient calls back, please route to POD 2.

## 2022-10-08 ENCOUNTER — Encounter: Payer: Self-pay | Admitting: Rehabilitative and Restorative Service Providers"

## 2022-10-08 ENCOUNTER — Ambulatory Visit: Payer: Medicare HMO | Attending: Sports Medicine | Admitting: Rehabilitative and Restorative Service Providers"

## 2022-10-08 DIAGNOSIS — M6281 Muscle weakness (generalized): Secondary | ICD-10-CM

## 2022-10-08 DIAGNOSIS — R2689 Other abnormalities of gait and mobility: Secondary | ICD-10-CM | POA: Diagnosis not present

## 2022-10-08 DIAGNOSIS — R262 Difficulty in walking, not elsewhere classified: Secondary | ICD-10-CM | POA: Diagnosis not present

## 2022-10-08 DIAGNOSIS — R252 Cramp and spasm: Secondary | ICD-10-CM | POA: Diagnosis not present

## 2022-10-08 DIAGNOSIS — M5459 Other low back pain: Secondary | ICD-10-CM

## 2022-10-08 DIAGNOSIS — M79605 Pain in left leg: Secondary | ICD-10-CM | POA: Diagnosis not present

## 2022-10-08 DIAGNOSIS — M545 Low back pain, unspecified: Secondary | ICD-10-CM | POA: Diagnosis not present

## 2022-10-08 NOTE — Therapy (Signed)
OUTPATIENT PHYSICAL THERAPY TREATMENT NOTE    Patient Name: NAGI LISONBEE MRN: [id number] DOB:01-24-1951, 72 y.o., male Today's Date: 10/08/2022  END OF SESSION:  PT End of Session - 10/08/22 1112     Visit Number 12    Date for PT Re-Evaluation 10/12/22    Authorization Type [employer]    Progress Note Due on Visit 20    PT Start Time 1015    PT Stop Time 1055    PT Time Calculation (min) 40 min    Activity Tolerance Patient tolerated treatment well    Behavior During Therapy WFL for tasks assessed/performed              Past Medical History:  Diagnosis Date   Aortic atherosclerosis (HCC)    CAD (coronary artery disease)    Diverticulosis    Emphysema lung (HCC)    Hypercholesteremia    Hyperlipemia    Hypertension    Memory changes    Smoker    Tinnitus of both ears    Past Surgical History:  Procedure Laterality Date   HAND SURGERY     multiple reconstructive surgeries    HEMORRHOID SURGERY  01/23/11   HEMORRHOID SURGERY     HERNIA REPAIR     LEG SURGERY     Patient Active Problem List   Diagnosis Date Noted   Memory loss 08/09/2022   Sciatica 08/07/2022   Coronary artery disease involving native coronary artery of native heart without angina pectoris 07/12/2021   Former smoker 06/08/2016   Atypical chest pain 06/08/2016   Pure hypercholesterolemia 06/08/2016   Family history of early CAD 06/08/2016   RBBB 06/08/2016    PCP: Darrow Bussing, MD  REFERRING PROVIDER: Delfin Gant, MD  REFERRING DIAG: M54.50 (ICD-10-CM) - Low back pain, unspecified  Rationale for Evaluation and Treatment: Rehabilitation  THERAPY DIAG:  Other low back pain  Cramp and spasm  Other abnormalities of gait and mobility  Muscle weakness (generalized)  Difficulty in walking, not elsewhere classified  ONSET DATE: 07/14/2022  SUBJECTIVE:                                                                                                                                                                                            SUBJECTIVE STATEMENT: Pt reports that he has gone golfing, but has not tried [employer] yet.  States that he feels his biggest challenge is left knee instability.  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 Yes NPRS scale: 0/10 Pain location: low  back Pain description:  soreness   Aggravating factors: sitting in some chairs Relieving factors: movement/stretches    PRECAUTIONS: Fall  WEIGHT BEARING RESTRICTIONS: No  FALLS:  Has patient fallen in last 6 months? Yes. Number of falls 2 falls in past month (reports left knee went out on him).  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  NEXT MD VISIT: as needed with Dr Penni Bombard  OBJECTIVE:   DIAGNOSTIC FINDINGS:  Lumbar Radiograph with Dr Penni Bombard unremarkable per patient  PATIENT SURVEYS:  Eval:  FOTO 45% (projected 66% by visit 10) 09/25/22  78%  SCREENING FOR RED FLAGS: Bowel or bladder incontinence: No Spinal tumors: No Cauda equina syndrome: No Compression fracture: No Abdominal aneurysm: No  COGNITION: Overall cognitive status: Within functional limits for tasks assessed.  Does state that he has noted some memory changes, so he is working with a Insurance account manager to further assess.   SENSATION: Denies numbness or tingling, but does have some radicular pain down LLE.  MUSCLE LENGTH: Hamstrings: tightness noted  POSTURE: rounded shoulders and forward head  PALPATION: Some spasms noted in lumbar  multifidi  LUMBAR ROM:   WFL  LOWER EXTREMITY ROM:     WFL  LOWER EXTREMITY MMT:    08/21/2022: Right leg strength is WFL Left hip strength is 4/5, left quad strength is 4/5, left hamstring strength is 4+/5 09/25/22 Left hip strength:  Flex 4+ ABD,5/5 Ext 5/5   Knee: Flex 5/5   Ext 4+/5   LUMBAR SPECIAL TESTS:  Slump test: slight positive on left side  FUNCTIONAL TESTS:  Eval: 5 times sit to stand: 13.90 sec with UE sec with decreased weight bearing through LLE  GAIT: Distance walked: >50 ft Assistive device utilized: None Level of assistance: Complete Independence Comments: Patient reports that he has been wearing the left knee brace secondary to him feeling that at times his left leg may give out on him.  TODAY'S TREATMENT:                                                                                                                               DATE: 10/08/2022  Recumbent bike level 3 x6 min with PT present to discuss status Standing hamstring stretch at stair 2x20 sec bilat FWD and lateral step downs at 6" step with UE support x10 bilat Step downs and back up from 6" step with UE support x10 bilat Standing forward T holding 10# x10 bilat Standing on flat side of bosu 2x30 sec with UE support Leg Press (seat at 8) 100# 2x10   DATE: 09/28/2022  Nustep level 5 x 7 min with PT present  to discuss status FWD step ups on 6" step without UE support x10 bilat Lateral step up, over and back x10 bilat  Standing hamstring stretch 2x20 sec bilat Standing rocker board x20 sec Leg Press (seat at 8) 95# 2x10 Seated on blue pball with unilateral UE support:  marching and LAQ 2x10 each bilat 4 way resisted gait with 10# cable pulley x3 each direction   DATE: 09/25/2022  Bike L4 x 5 min with PT present to discuss status Standing hip flexor with RTB 2x10 B Seated hip flexor x 10 RTB L x 20 Seated LAQ with RTB x 10 then with GTB x 10 Seated piriformis stretch x 30 sec B Prone  press ups x 5 Side-lying clamshell  with RTB x10 L only for demo LTR for stretch vs mobility x 20 sec B     PATIENT EDUCATION:  Education details: aquatic progressions/ modifications Person educated: Patient Education method: Programmer, multimedia, Demonstration Education comprehension: verbalized understanding and returned demonstration  HOME EXERCISE PROGRAM: Access Code: [email or web address] URL: [email or web address] Date: 09/25/2022 Prepared by: Raynelle Fanning  Exercises - Seated Piriformis Stretch with Trunk Bend  - 2 x daily - 7 x weekly - 1 sets - 3 reps - 30-60 sec  hold - Supine Lower Trunk Rotation  - 1 x daily - 7 x weekly - 1 sets - 10 reps - Supine Hamstring Stretch with Strap  - 1 x daily - 7 x weekly - 1 sets - 2 reps - 20 sec hold - Supine March with Posterior Pelvic Tilt  - 1 x daily - 7 x weekly - 2 sets - 10 reps - Prone Scapular Retraction Y  - 1 x daily - 7 x weekly - 2 sets - 10 reps - Prone Press Up  - 1 x daily - 7 x weekly - 2 sets - 10 reps - Cat Cow  - 1 x daily - 7 x weekly - 2 sets - 10 reps - Standing 'L' Stretch at Counter  - 1 x daily - 7 x weekly - 1 sets - 2 reps - 20 sec hold - Seated Sciatic Tensioner  - 1 x daily - 7 x weekly - 2 sets - 10 reps - 1 sec hold - Standing Hamstring Stretch on Chair  - 1 x daily - 7 x weekly - 1 sets - 3 reps - 30 sec hold - Quadricep Stretch with Chair and Counter Support  - 1 x daily - 7 x weekly - 1 sets - 3 reps - 30 sec hold - Supine Bridge  - 1 x daily - 7 x weekly - 2 sets - 10 reps - Clamshell with Resistance  - 1 x daily - 3-4 x weekly - 1 sets - 10 reps - Marching with Resistance  - 1 x daily - 3 x weekly - 1-3 sets - 10 reps - Sitting Knee Extension with Resistance  - 1 x daily - 3 x weekly - 1-3 sets - 10 reps - 10 seconds hold  AQUATIC Access Code: [email or web address] URL: [email or web address] Date: 09/18/2022 Prepared by: [facility] - Outpatient Rehab - [address] This aquatic home exercise program from  MedBridge utilizes pictures from land based exercises, but has been adapted prior to lamination and issuance.   ASSESSMENT:  CLINICAL IMPRESSION: KEE DEPA is progressing well and is on track for discharge next visit.  Patient is hoping to try to do some work for [employer] this week, if they are doing any assignments this  week.  Patient is progressing with strengthening and balance during session today.  Patient without any complaints of pain during session, but did have some cramping when he initially started performing hamstring stretch, but subsided quickly.  Patient anticipated for discharge at next visit if no changes.   OBJECTIVE IMPAIRMENTS: decreased balance, difficulty walking, decreased strength, increased muscle spasms, impaired flexibility, postural dysfunction, and pain.   ACTIVITY LIMITATIONS: lifting, bending, and squatting  PARTICIPATION LIMITATIONS: driving and community activity  PERSONAL FACTORS: Time since onset of injury/illness/exacerbation are also affecting patient's functional outcome.   REHAB POTENTIAL: Good  CLINICAL DECISION MAKING: Stable/uncomplicated  EVALUATION COMPLEXITY: Low   GOALS: Goals reviewed with patient? Yes  SHORT TERM GOALS: Target date: 09/07/2022  Patient will be independent with initial HEP. Baseline: Goal status: MET  2.  Patient will report at least a 40% improvement in symptoms during daily activities. Baseline:  Goal status: MET 09/20/22   LONG TERM GOALS: Target date: 10/12/2022  Patient will be independent with advanced HEP. Baseline:  Goal status: MET  2.  Patient will increase FOTO to at least 66% to demonstrate improvements in functional mobility. Baseline: 45% Goal status: MET  3.  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 Baseline:  Goal status: IN PROGRESS  4.  Patient will report ability to return to golf with  no increase in pain. Baseline:  Goal status: MET  5.  Patient will improve functional balance and not have any instances of losing balance or near loss of balance in 2 weeks leading up to discharge. Baseline:  Goal status: IN PROGRESS (denies any falls as of 09/28/2022)   PLAN:  PT FREQUENCY: 2x/week  PT DURATION: 8 weeks  PLANNED INTERVENTIONS: Therapeutic exercises, Therapeutic activity, Neuromuscular re-education, Balance training, Gait training, Patient/Family education, Self Care, Joint mobilization, Joint manipulation, Stair training, Aquatic Therapy, Dry Needling, Electrical stimulation, Spinal manipulation, Spinal mobilization, Cryotherapy, Moist heat, Taping, Traction, Ultrasound, Ionotophoresis 4mg /ml Dexamethasone, Manual therapy, and Re-evaluation.  PLAN FOR NEXT SESSION: discharge next visit    Reather Laurence, PT 10/08/22 11:21 AM   [facility] [address], [address] [place], [place] [address] Phone # [phone] Fax [phone]

## 2022-10-12 ENCOUNTER — Encounter: Payer: Self-pay | Admitting: Rehabilitative and Restorative Service Providers"

## 2022-10-12 ENCOUNTER — Ambulatory Visit: Payer: Medicare HMO | Admitting: Rehabilitative and Restorative Service Providers"

## 2022-10-12 DIAGNOSIS — R2689 Other abnormalities of gait and mobility: Secondary | ICD-10-CM | POA: Diagnosis not present

## 2022-10-12 DIAGNOSIS — M5459 Other low back pain: Secondary | ICD-10-CM | POA: Diagnosis not present

## 2022-10-12 DIAGNOSIS — R252 Cramp and spasm: Secondary | ICD-10-CM | POA: Diagnosis not present

## 2022-10-12 DIAGNOSIS — M6281 Muscle weakness (generalized): Secondary | ICD-10-CM

## 2022-10-12 DIAGNOSIS — R262 Difficulty in walking, not elsewhere classified: Secondary | ICD-10-CM | POA: Diagnosis not present

## 2022-10-12 NOTE — Therapy (Signed)
OUTPATIENT PHYSICAL THERAPY TREATMENT NOTE    Patient Name: Evan [name] MRN: [id number] DOB:1950-12-11, 72 y.o., male Today's Date: 10/12/2022  END OF SESSION:  PT End of Session - 10/12/22 0836     Visit Number 13    Date for PT Re-Evaluation 10/12/22    Authorization Type A[employer]na Medicare    PT Start Time 0830    PT Stop Time 0915    PT Time Calculation (min) 45 min    Activity Tolerance Patient tolerated treatment well    Behavior During Therapy [facility] for tasks assessed/performed              Past Medical History:  Diagnosis Date   Aortic atherosclerosis (HCC)    CAD (coronary artery disease)    Diverticulosis    Emphysema lung (HCC)    Hypercholesteremia    Hyperlipemia    Hypertension    Memory changes    Smoker    Tinnitus of both ears    Past Surgical History:  Procedure Laterality Date   HAND SURGERY     multiple reconstructive surgeries    HEMORRHOID SURGERY  01/23/11   HEMORRHOID SURGERY     HERNIA REPAIR     LEG SURGERY     Patient Active Problem List   Diagnosis Date Noted   Memory loss 08/09/2022   Sciatica 08/07/2022   Coronary artery disease involving native coronary artery of native heart without angina pectoris 07/12/2021   Former smoker 06/08/2016   Atypical chest pain 06/08/2016   Pure hypercholesterolemia 06/08/2016   Family history of early CAD 06/08/2016   RBBB 06/08/2016    PCP: Darrow Bussing, MD  REFERRING PROVIDER: Delfin Gant, MD  REFERRING DIAG: M54.50 (ICD-10-CM) - Low back pain, unspecified  Rationale for Evaluation and Treatment: Rehabilitation  THERAPY DIAG:  Other low back pain  Cramp and spasm  Other abnormalities of gait and mobility  Muscle weakness (generalized)  ONSET DATE: 07/14/2022  SUBJECTIVE:                                                                                                                                                                                           SUBJECTIVE  STATEMENT: Pt reports that 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  Denies pain, just states some stiffness.  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 No NPRS scale: 0/10 Pain location: low back Pain description:  soreness   Aggravating factors: sitting in  some chairs Relieving factors: movement/stretches    PRECAUTIONS: Fall  WEIGHT BEARING RESTRICTIONS: No  FALLS:  Has patient fallen in last 6 months? Yes. Number of falls 2 falls in past month (reports left knee went out on him).  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  NEXT MD VISIT: as needed with Dr Penni Bombard  OBJECTIVE:   DIAGNOSTIC FINDINGS:  Lumbar Radiograph with Dr Penni Bombard unremarkable per patient  PATIENT SURVEYS:  Eval:  FOTO 45% (projected 66% by visit 10) 09/25/22  78%  SCREENING FOR RED FLAGS: Bowel or bladder incontinence: No Spinal tumors: No Cauda equina syndrome: No Compression fracture: No Abdominal aneurysm: No  COGNITION: Overall cognitive status: Within functional limits for tasks assessed.  Does state that he has noted some memory changes, so he is working with a Insurance account manager to further assess.   SENSATION: Denies numbness or tingling, but does have some radicular pain down LLE.  MUSCLE LENGTH: Hamstrings: tightness noted  POSTURE: rounded shoulders and forward head  PALPATION: Some spasms noted in lumbar multifidi  LUMBAR ROM:   WFL  LOWER  EXTREMITY ROM:     WFL  LOWER EXTREMITY MMT:    08/21/2022: Right leg strength is WFL Left hip strength is 4/5, left quad strength is 4/5, left hamstring strength is 4+/5 09/25/22 Left hip strength:  Flex 4+ ABD,5/5 Ext 5/5   Knee: Flex 5/5   Ext 4+/5   10/12/2022 Left hip and quad strength: Flex 5-/5 Right hip and quad strength: Flex 5/5  LUMBAR SPECIAL TESTS:  Eval:  Slump test: slight positive on left side  FUNCTIONAL TESTS:  Eval: 5 times sit to stand: 13.90 sec with UE sec with decreased weight bearing through LLE  10/12/2022: 5 times sit to stand:  12.20 sec without UE use  GAIT: Distance walked: >50 ft Assistive device utilized: None Level of assistance: Complete Independence Comments: Patient reports that he has been wearing the left knee brace secondary to him feeling that at times his left leg may give out on him.  TODAY'S TREATMENT:                                                                                                                               DATE: 10/08/2022  Nustep with BLE only level 5 x 7 min with PT present to discuss status Standing hamstring stretch at stair 2x20 sec bilat FWD step ups onto bosu x10 bilat (at barre for balance) Standing DF/PF on rocker board x1 min 5 times sit to stand 20 reps of posterior pelvic tilts  2 x10 on each leg standing Ts with #10 dumbbell  2x15  sec of wall squats  Leg press 100lb 2x10 3x30 sec static stance on bosu ball (flat side with no UE support)   DATE: 10/08/2022  Recumbent bike level 3 x6 min with PT present to discuss status Standing hamstring stretch at stair 2x20 sec bilat FWD and lateral step downs at 6" step with UE support x10 bilat Step downs and back up from 6" step with UE support x10 bilat Standing forward T holding 10# x10 bilat Standing on flat side of bosu 2x30 sec with UE support Leg Press (seat at 8) 100# 2x10   DATE: 09/28/2022  Nustep level 5 x 7 min with PT present to discuss  status FWD step ups on 6" step without UE support x10 bilat Lateral step up, over and back x10 bilat  Standing hamstring stretch 2x20 sec bilat Standing rocker board x20 sec Leg Press (seat at 8) 95# 2x10 Seated on blue pball with unilateral UE support:  marching and LAQ 2x10 each bilat 4 way resisted gait with 10# cable pulley x3 each direction     PATIENT EDUCATION:  Education details: aquatic progressions/ modifications Person educated: Patient Education method: Programmer, multimedia, Demonstration Education comprehension: verbalized understanding and returned demonstration  HOME EXERCISE PROGRAM: Access Code: [email or web address] URL: [email or web address] Date: 09/25/2022 Prepared by: Raynelle Fanning  Exercises - Seated Piriformis Stretch with Trunk Bend  - 2 x daily - 7 x weekly - 1 sets - 3 reps - 30-60 sec  hold - Supine Lower Trunk Rotation  - 1 x daily - 7 x weekly - 1 sets - 10 reps - Supine Hamstring Stretch with Strap  - 1 x daily - 7 x weekly - 1 sets - 2 reps - 20 sec hold - Supine March with Posterior Pelvic Tilt  - 1 x daily - 7 x weekly - 2 sets - 10 reps - Prone Scapular Retraction Y  - 1 x daily - 7 x weekly - 2 sets - 10 reps - Prone Press Up  - 1 x daily - 7 x weekly - 2 sets - 10 reps - Cat Cow  - 1 x daily - 7 x weekly - 2 sets - 10 reps - Standing 'L' Stretch at Counter  - 1 x daily - 7 x weekly - 1 sets - 2 reps - 20 sec hold - Seated Sciatic Tensioner  - 1 x daily - 7 x weekly - 2 sets - 10 reps - 1 sec hold - Standing Hamstring Stretch on Chair  - 1 x daily - 7 x weekly - 1 sets - 3 reps - 30 sec hold - Quadricep Stretch with Chair and Counter Support  - 1 x daily - 7 x weekly - 1 sets - 3 reps - 30 sec hold - Supine Bridge  - 1 x daily - 7 x weekly - 2 sets - 10 reps - Clamshell with Resistance  - 1 x daily - 3-4 x weekly - 1 sets - 10 reps - Marching with Resistance  - 1 x daily - 3 x weekly - 1-3 sets - 10 reps - Sitting Knee Extension with Resistance  - 1 x daily  - 3 x weekly - 1-3 sets - 10 reps - 10 seconds hold  AQUATIC Access Code: [email or web address] URL: [email or web address] Date: 09/18/2022 Prepared by: [facility] - Outpatient Rehab - [address] This aquatic home exercise program from [employer] utilizes pictures from land based exercises, but has been adapted prior to lamination and issuance.   ASSESSMENT:  CLINICAL  IMPRESSION: Evan [name] is progressing well and ready for discharge from physical therapy services. Patient presented to clinic with no current pain and denied pain throughout the entirety of the session thus showing improvements in muscular endurance and increased activity tolerance needed for return to labor intensive activities. Patient continued to demonstrate gains in global hip strength required for lifting, bending, squatting and associated return to community activity. Patient responded well to progression of interventions only requiring longer therapeutic to rest, but never demonstrated form failure demonstrating increased proprioception and proper neuromuscular control. Patient is ready for discharge on this date to continue with HEP and ability to golf and work with [employer].    OBJECTIVE IMPAIRMENTS: decreased balance, difficulty walking, decreased strength, increased muscle spasms, impaired flexibility, postural dysfunction, and pain.   ACTIVITY LIMITATIONS: lifting, bending, and squatting  PARTICIPATION LIMITATIONS: driving and community activity  PERSONAL FACTORS: Time since onset of injury/illness/exacerbation are also affecting patient's functional outcome.   REHAB POTENTIAL: Good  CLINICAL DECISION MAKING: Stable/uncomplicated  EVALUATION COMPLEXITY: Low   GOALS: Goals reviewed with patient? Yes  SHORT TERM GOALS: Target date: 09/07/2022  Patient will be independent with initial HEP. Baseline: Goal status: MET  2.  Patient will report at least a 40% improvement in symptoms during  daily activities. Baseline:  Goal status: MET 09/20/22   LONG TERM GOALS: Target date: 10/12/2022  Patient will be independent with advanced HEP. Baseline:  Goal status: MET  2.  Patient will increase FOTO to at least 66% to demonstrate improvements in functional mobility. Baseline: 45% Goal status: MET  3.  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 Baseline:  Goal status: MET  4.  Patient will report ability to return to golf with no increase in pain. Baseline:  Goal status: MET  5.  Patient will improve functional balance and not have any instances of losing balance or near loss of balance in 2 weeks leading up to discharge. Baseline:  Goal status: MET   PLAN:  PT FREQUENCY: 2x/week  PT DURATION: 8 weeks  PLANNED INTERVENTIONS: Therapeutic exercises, Therapeutic activity, Neuromuscular re-education, Balance training, Gait training, Patient/Family education, Self Care, Joint mobilization, Joint manipulation, Stair training, Aquatic Therapy, Dry Needling, Electrical stimulation, Spinal manipulation, Spinal mobilization, Cryotherapy, Moist heat, Taping, Traction, Ultrasound, Ionotophoresis 4mg /ml Dexamethasone, Manual therapy, and Re-evaluation.   PHYSICAL THERAPY DISCHARGE SUMMARY  Patient agrees to discharge. Patient goals were met. Patient is being discharged due to meeting the stated rehab goals.    Reather Laurence, PT 10/12/22 9:37 AM   [facility] [address] [place], [place] [address] Phone # [phone] Fax [phone]

## 2022-10-16 ENCOUNTER — Other Ambulatory Visit: Payer: Self-pay

## 2022-10-16 NOTE — Progress Notes (Signed)
error 

## 2022-10-24 ENCOUNTER — Encounter: Payer: Self-pay | Admitting: Neurology

## 2022-10-24 ENCOUNTER — Ambulatory Visit: Payer: Medicare HMO | Admitting: Neurology

## 2022-10-24 VITALS — BP 126/72 | HR 67 | Ht 70.0 in | Wt 206.0 lb

## 2022-10-24 DIAGNOSIS — G3184 Mild cognitive impairment, so stated: Secondary | ICD-10-CM

## 2022-10-24 NOTE — Patient Instructions (Signed)
Continue current medications Submit paperwork for [facility] approval Follow-up in 6 months or sooner if worse.

## 2022-10-24 NOTE — Progress Notes (Signed)
[facility]  PATIENT: Evan [name] DOB: October 30, 1950  REQUESTING CLINICIAN: Koirala, Dibas, MD HISTORY FROM: Patient  REASON FOR VISIT: Memory loss/Strong family history of Dementia    HISTORICAL  CHIEF COMPLAINT:  Chief Complaint  Patient presents with   Follow-up    Rm 13 memory f/u to discuss results and treatment, with wife Evan [name]   INTERVAL HISTORY 10/24/2022: Patient presents today for follow-up, he is accompanied by wife.  Last visit was in May and at that time his diagnosis was mild cognitive impairment.  During this time we obtained the ATN profile which was positive for Alzheimer disease biomarker.  We obtained a brain MRI and PET amyloid which was positive for presence of amyloid.  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  He is interested in starting [place].  He feels like his memory since last visit is stable.  He does have trouble with remembering names of familiar persons, misplacing items and sometimes being forgetful.  Wife has been helping him.   HISTORY OF PRESENT ILLNESS:  This is a 72 year old gentleman past medical history of hypertension, hyperlipidemia, CAD, emphysema who is presenting with memory complaints. Memory problems described as trouble remembering names of friends that he has for more than 40 years.  He reports when driving he have to use the GPS even though he has been living in [place] for more than 50 years.  He denies being lost in familiar place and no recent accidents. He has to write everything down or if he will forget.  He depends on the calendar otherwise he will miss his appointments.  He also have to write notes.  He feels like his wife has been complaining about his memory issues.  Other than the memory concern, patient is independent all activities of daily living.  He reports a strong family history of Alzheimer disease including his father and his uncles, his aunties and grand parents on his father  side.   TBI:   No past history of TBI Stroke:    no past history of stroke Seizures:   no past history of seizures Sleep:  no history of sleep apnea.   Mood:  patient denies anxiety and depression Family history of Dementia: Father, uncles, sister and grandfather on father side  Functional status: independent in all ADLs and IADLs Patient lives with spouse at home. Cooking: no issues Cleaning: no issues Shopping: no issues Bathing: no issues  Toileting:  no issues  Driving: has to use GPS, no recent accident  Bills: No issues   Ever left the stove on by accident?: Denies   Forget how to use items around the house?: Denies Getting lost going to familiar places?: Denies  Forgetting loved ones names?: No  Word finding difficulty? No  Sleep: Perfect but in the last 2 weeks had sciatica which keeps him up all night   OTHER MEDICAL CONDITIONS: Hypertension, Hyperlipidemia, CAD, emphysema    REVIEW OF SYSTEMS: Full 14 system review of systems performed and negative with exception of: As noted in the HPI   ALLERGIES: Allergies  Allergen Reactions   Hydrochlorothiazide Other (See Comments)    HOME MEDICATIONS: Outpatient Medications Prior to Visit  Medication Sig Dispense Refill   acetaminophen (TYLENOL) 500 MG tablet Take 1,000 mg by mouth every 6 (six) hours as needed.     aspirin EC 81 MG tablet Take 1 tablet (81 mg total) by mouth daily. Swallow whole. 90 tablet 3   losartan (COZAAR) 50  MG tablet Take 50 mg by mouth at bedtime.  0   Multiple Vitamins-Minerals (MULTIVITAMIN MEN 50+ PO) Take by mouth.     Omega-3 Fatty Acids (FISH OIL) 1000 MG CAPS Take 1,000 mg by mouth daily.     rosuvastatin (CRESTOR) 40 MG tablet Take 1 tablet (40 mg total) by mouth daily. 90 tablet 3   TAMSULOSIN HCL PO Take 0.4 mg by mouth daily.     bacitracin ointment Apply 1 Application topically 2 (two) times daily. 120 g 0   doxycycline (VIBRAMYCIN) 100 MG capsule Take 1 capsule (100 mg total) by  mouth 2 (two) times daily. 20 capsule 0   gabapentin (NEURONTIN) 100 MG capsule Take 100 mg by mouth 3 (three) times daily.     naproxen sodium (ALEVE) 220 MG tablet Take 220 mg by mouth daily as needed (for pain).     nicotine polacrilex (NICORETTE) 2 MG gum Take 2 mg by mouth as needed for smoking cessation.     Tdap (BOOSTRIX) 5-2.5-18.5 LF-MCG/0.5 injection Inject into the muscle. (Patient not taking: Reported on 10/24/2022) 0.5 mL 0   No facility-administered medications prior to visit.    PAST MEDICAL HISTORY: Past Medical History:  Diagnosis Date   Aortic atherosclerosis (HCC)    CAD (coronary artery disease)    Diverticulosis    Emphysema lung (HCC)    Hypercholesteremia    Hyperlipemia    Hypertension    Memory changes    Smoker    Tinnitus of both ears     PAST SURGICAL HISTORY: Past Surgical History:  Procedure Laterality Date   HAND SURGERY     multiple reconstructive surgeries    HEMORRHOID SURGERY  01/23/11   HEMORRHOID SURGERY     HERNIA REPAIR     LEG SURGERY      FAMILY HISTORY: Family History  Problem Relation Age of Onset   Alzheimer's disease Father    Alzheimer's disease Paternal Uncle    Alzheimer's disease Paternal Grandfather     SOCIAL HISTORY: Social History   Socioeconomic History   Marital status: Married    Spouse name: Not on file   Number of children: 3   Years of education: Not on file   Highest education level: Not on file  Occupational History   Not on file  Tobacco Use   Smoking status: Former    Current packs/day: 0.00    Types: Cigarettes    Quit date: 08/2015    Years since quitting: 7.2   Smokeless tobacco: Never  Vaping Use   Vaping status: Never Used  Substance and Sexual Activity   Alcohol use: Not Currently    Comment: weekly   Drug use: No   Sexual activity: Yes  Other Topics Concern   Not on file  Social History Narrative   Right handed   Caffeine-2 cups daily   Lives with wife   Social Determinants  of Health   Financial Resource Strain: Not on file  Food Insecurity: Not on file  Transportation Needs: Not on file  Physical Activity: Not on file  Stress: Not on file  Social Connections: Not on file  Intimate Partner Violence: Not on file     PHYSICAL EXAM   GENERAL EXAM/CONSTITUTIONAL: Vitals:  Vitals:   10/24/22 0951  BP: 126/72  Pulse: 67  SpO2: 99%  Weight: 206 lb (93.4 kg)  Height: 5\' 10"  (1.778 m)   Body mass index is 29.56 kg/m. Wt Readings from Last 3 Encounters:  10/24/22 206 lb (93.4 kg)  08/13/22 203 lb 8 oz (92.3 kg)  07/27/21 206 lb (93.4 kg)   Patient is in no distress; well developed, nourished and groomed; neck is supple  MUSCULOSKELETAL: Gait, strength, tone, movements noted in Neurologic exam below  NEUROLOGIC: MENTAL STATUS:      No data to display            10/08/2022   11:42 AM 09/19/2022   10:11 AM 08/13/2022    9:03 AM  Montreal Cognitive Assessment   Visuospatial/ Executive (0/5) 5 5 5   Naming (0/3) 2 3 3   Attention: Read list of digits (0/2) 2 2 2   Attention: Read list of letters (0/1) 1 1 1   Attention: Serial 7 subtraction starting at 100 (0/3) 3 3 3   Language: Repeat phrase (0/2) 2 1 1   Language : Fluency (0/1) 1 1 1   Abstraction (0/2) 2 2 2   Delayed Recall (0/5) 3 3 2   Orientation (0/6) 6 6 6   Total 27 27 26   Adjusted Score (based on education) 27 27 26      CRANIAL NERVE:  2nd, 3rd, 4th, 6th- visual fields full to confrontation, extraocular muscles intact, no nystagmus 5th - facial sensation symmetric 7th - facial strength symmetric 8th - hearing intact 9th - palate elevates symmetrically, uvula midline 11th - shoulder shrug symmetric 12th - tongue protrusion midline  MOTOR:  normal bulk and tone, full strength in the BUE, BLE  SENSORY:  normal and symmetric to light touch  COORDINATION:  finger-nose-finger, fine finger movements normal  GAIT/STATION:  normal   DIAGNOSTIC DATA (LABS, IMAGING,  TESTING) - I reviewed patient records, labs, notes, testing and imaging myself where available.  Lab Results  Component Value Date   WBC 6.5 05/12/2016   HGB 15.8 05/12/2016   HCT 46.0 05/12/2016   MCV 85.0 05/12/2016   PLT 291 05/12/2016      Component Value Date/Time   NA 139 05/12/2016 0947   K 3.9 05/12/2016 0947   CL 104 05/12/2016 0947   CO2 27 05/12/2016 0947   GLUCOSE 122 (H) 05/12/2016 0947   BUN 15 05/12/2016 0947   CREATININE 0.93 05/12/2016 0947   CALCIUM 9.3 05/12/2016 0947   ALT 37 10/11/2021 0742   GFRNONAA >60 05/12/2016 0947   GFRAA >60 05/12/2016 0947   Lab Results  Component Value Date   CHOL 138 10/11/2021   HDL 55 10/11/2021   LDLCALC 61 10/11/2021   TRIG 122 10/11/2021   CHOLHDL 2.5 10/11/2021   No results found for: "HGBA1C" No results found for: "VITAMINB12" No results found for: "TSH"  HgA1C 6.1 TSH 0.92  ATN Profile: AB42/40 0.094 (low). Plasma NfL high, p Tau normal   ApoE4 Genotype: E3/E4   MRI Brain 09/24/2022 Brain volume was normal for age Some scattered T2/FLAIR hyperintense foci in the cerebral hemispheres with small confluencies in the periatrial white matter.  This is most consistent with mild chronic microvascular ischemic change, fairly typical for age and stable compared to the 06/09/2021 MRI. Susceptibility weighted images did not show any hemosiderin deposition.   No acute findings.   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  ASSESSMENT AND PLAN  72 y.o. year old male with history of hypertension, hyperlipidemia, CAD, emphysema who is presenting for follow up for mild cognitive impairment.  He does have all biomarker of Alzheimer disease including positive  ATN profile, PET scan positive for amyloid deposit and heterozygous for ApoE4 genotype.  Plan for now is to start patient on Leqembi as he wants  the medication.  We discussed side effect of the medication including brain bleed, brain edema and also the fact that he cannot get any thrombolysis in the event of a stroke.  He voiced understanding.  Once he scheduled his first infusion we will start scheduling the follow-up MRIs.  I will see him in 6 months for follow-up.    1. Mild cognitive impairment      Patient Instructions  Continue current medications Submit paperwork for Leqembi approval Follow-up in 6 months or sooner if worse.  No orders of the defined types were placed in this encounter.   No orders of the defined types were placed in this encounter.   Return in about 6 months (around 04/26/2023).  I have spent a total of 45 minutes dedicated to this patient today, preparing to see patient, performing a medically appropriate examination and evaluation, ordering tests and/or medications and procedures, and counseling and educating the patient/family/caregiver; independently interpreting result and communicating results to the family/patient/caregiver; and documenting clinical information in the electronic medical record.  Windell Norfolk, MD 10/24/2022, 1:06 PM  [facility] [address] [place], [place] [address] [phone]

## 2022-11-12 DIAGNOSIS — N529 Male erectile dysfunction, unspecified: Secondary | ICD-10-CM | POA: Diagnosis not present

## 2022-11-12 DIAGNOSIS — Z87891 Personal history of nicotine dependence: Secondary | ICD-10-CM | POA: Diagnosis not present

## 2022-11-12 DIAGNOSIS — Z8249 Family history of ischemic heart disease and other diseases of the circulatory system: Secondary | ICD-10-CM | POA: Diagnosis not present

## 2022-11-12 DIAGNOSIS — R269 Unspecified abnormalities of gait and mobility: Secondary | ICD-10-CM | POA: Diagnosis not present

## 2022-11-12 DIAGNOSIS — I700 Atherosclerosis of aorta: Secondary | ICD-10-CM | POA: Diagnosis not present

## 2022-11-12 DIAGNOSIS — I10 Essential (primary) hypertension: Secondary | ICD-10-CM | POA: Diagnosis not present

## 2022-11-12 DIAGNOSIS — I2510 Atherosclerotic heart disease of native coronary artery without angina pectoris: Secondary | ICD-10-CM | POA: Diagnosis not present

## 2022-11-12 DIAGNOSIS — Z85828 Personal history of other malignant neoplasm of skin: Secondary | ICD-10-CM | POA: Diagnosis not present

## 2022-11-12 DIAGNOSIS — Z9181 History of falling: Secondary | ICD-10-CM | POA: Diagnosis not present

## 2022-11-12 DIAGNOSIS — E785 Hyperlipidemia, unspecified: Secondary | ICD-10-CM | POA: Diagnosis not present

## 2022-11-12 DIAGNOSIS — R32 Unspecified urinary incontinence: Secondary | ICD-10-CM | POA: Diagnosis not present

## 2022-11-12 DIAGNOSIS — M5440 Lumbago with sciatica, unspecified side: Secondary | ICD-10-CM | POA: Diagnosis not present

## 2022-12-14 DIAGNOSIS — R7303 Prediabetes: Secondary | ICD-10-CM | POA: Diagnosis not present

## 2023-01-03 ENCOUNTER — Ambulatory Visit: Payer: Medicare HMO | Admitting: Neurology

## 2023-01-21 ENCOUNTER — Ambulatory Visit: Payer: Medicare HMO | Admitting: Neurology

## 2023-02-01 ENCOUNTER — Other Ambulatory Visit: Payer: Self-pay | Admitting: Neurology

## 2023-02-01 DIAGNOSIS — G3184 Mild cognitive impairment, so stated: Secondary | ICD-10-CM

## 2023-02-05 ENCOUNTER — Telehealth: Payer: Self-pay | Admitting: Neurology

## 2023-02-05 NOTE — Telephone Encounter (Signed)
sent to GI they obtain [employer] auth. Twp orders were placed for after his next two infusions. [phone]

## 2023-02-21 DIAGNOSIS — Z006 Encounter for examination for normal comparison and control in clinical research program: Secondary | ICD-10-CM | POA: Diagnosis not present

## 2023-02-21 DIAGNOSIS — G3184 Mild cognitive impairment, so stated: Secondary | ICD-10-CM | POA: Diagnosis not present

## 2023-03-20 DIAGNOSIS — M79621 Pain in right upper arm: Secondary | ICD-10-CM | POA: Diagnosis not present

## 2023-03-20 DIAGNOSIS — G5601 Carpal tunnel syndrome, right upper limb: Secondary | ICD-10-CM | POA: Diagnosis not present

## 2023-03-26 DIAGNOSIS — D225 Melanocytic nevi of trunk: Secondary | ICD-10-CM | POA: Diagnosis not present

## 2023-03-26 DIAGNOSIS — Z789 Other specified health status: Secondary | ICD-10-CM | POA: Diagnosis not present

## 2023-03-26 DIAGNOSIS — Z08 Encounter for follow-up examination after completed treatment for malignant neoplasm: Secondary | ICD-10-CM | POA: Diagnosis not present

## 2023-03-26 DIAGNOSIS — L538 Other specified erythematous conditions: Secondary | ICD-10-CM | POA: Diagnosis not present

## 2023-03-26 DIAGNOSIS — L814 Other melanin hyperpigmentation: Secondary | ICD-10-CM | POA: Diagnosis not present

## 2023-03-26 DIAGNOSIS — L2989 Other pruritus: Secondary | ICD-10-CM | POA: Diagnosis not present

## 2023-03-26 DIAGNOSIS — L820 Inflamed seborrheic keratosis: Secondary | ICD-10-CM | POA: Diagnosis not present

## 2023-03-26 DIAGNOSIS — Z85828 Personal history of other malignant neoplasm of skin: Secondary | ICD-10-CM | POA: Diagnosis not present

## 2023-03-26 DIAGNOSIS — L821 Other seborrheic keratosis: Secondary | ICD-10-CM | POA: Diagnosis not present

## 2023-03-26 DIAGNOSIS — L570 Actinic keratosis: Secondary | ICD-10-CM | POA: Diagnosis not present

## 2023-04-12 DIAGNOSIS — Z008 Encounter for other general examination: Secondary | ICD-10-CM | POA: Diagnosis not present

## 2023-04-15 ENCOUNTER — Telehealth: Payer: Self-pay | Admitting: Neurology

## 2023-04-15 DIAGNOSIS — M5412 Radiculopathy, cervical region: Secondary | ICD-10-CM | POA: Diagnosis not present

## 2023-04-15 DIAGNOSIS — G5601 Carpal tunnel syndrome, right upper limb: Secondary | ICD-10-CM | POA: Diagnosis not present

## 2023-04-15 NOTE — Telephone Encounter (Signed)
 Pt said need an MRI scheduled before next infusion appt. Last infusion appt on 04/22/23 has been cancel.

## 2023-04-15 NOTE — Telephone Encounter (Signed)
 I don't see where he ever scheduled the MRI. I sent a message to [facility] to call him and get him scheduled.

## 2023-04-17 ENCOUNTER — Telehealth: Payer: Self-pay

## 2023-04-17 NOTE — Telephone Encounter (Signed)
 Called and completed the faq scored a 0

## 2023-04-18 ENCOUNTER — Encounter: Payer: Self-pay | Admitting: Neurology

## 2023-04-22 ENCOUNTER — Ambulatory Visit
Admission: RE | Admit: 2023-04-22 | Discharge: 2023-04-22 | Disposition: A | Discharge status: Home / Self Care | Payer: Medicare HMO | Source: Ambulatory Visit | Attending: Neurology | Admitting: Neurology

## 2023-04-22 DIAGNOSIS — G3184 Mild cognitive impairment, so stated: Secondary | ICD-10-CM | POA: Diagnosis not present

## 2023-04-22 DIAGNOSIS — G309 Alzheimer's disease, unspecified: Secondary | ICD-10-CM | POA: Diagnosis not present

## 2023-04-23 ENCOUNTER — Telehealth: Payer: Self-pay

## 2023-04-23 NOTE — Telephone Encounter (Signed)
 Holly from infusion wants to know where we are in the process of getting mri ordered and scheduled because they need that for his infusion medication of lequembi to continue

## 2023-04-23 NOTE — Telephone Encounter (Signed)
 We are waiting for the official read but per my read, no evidence of hemorrhage or brain swelling. We can proceed with scheduling follow up infusions.

## 2023-04-23 NOTE — Telephone Encounter (Signed)
 Informed holly infusion we are waiting on official read

## 2023-04-24 ENCOUNTER — Other Ambulatory Visit: Payer: Medicare HMO

## 2023-04-24 DIAGNOSIS — G5601 Carpal tunnel syndrome, right upper limb: Secondary | ICD-10-CM | POA: Diagnosis not present

## 2023-04-24 NOTE — Progress Notes (Signed)
 His MRI did not show evidence of hemorrhage or bleed. Can we complete his forms and submit it for infusion.

## 2023-04-26 DIAGNOSIS — G5601 Carpal tunnel syndrome, right upper limb: Secondary | ICD-10-CM | POA: Diagnosis not present

## 2023-05-01 ENCOUNTER — Encounter: Payer: Self-pay | Admitting: Neurology

## 2023-05-01 ENCOUNTER — Ambulatory Visit: Payer: Medicare HMO | Admitting: Neurology

## 2023-05-01 VITALS — BP 117/67 | HR 71 | Ht 70.0 in | Wt 224.5 lb

## 2023-05-01 DIAGNOSIS — G309 Alzheimer's disease, unspecified: Secondary | ICD-10-CM

## 2023-05-01 DIAGNOSIS — G3184 Mild cognitive impairment, so stated: Secondary | ICD-10-CM | POA: Diagnosis not present

## 2023-05-01 NOTE — Progress Notes (Signed)
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    HISTORICAL  CHIEF COMPLAINT:  Chief Complaint  Patient presents with   Follow-up    Rm 12, wife. Stable memory wise.     INTERVAL HISTORY 05/01/2023:  Patient presents tday for follow-up, last visit was in July, since then he has been doing well.  He has started [facility] treatment, so far completed 5 infusions.  He denies any side effect from the infusion.  His surveillance MRI has been negative for any side effect of the Leqembi including brain bleeding or brain swelling.  Overall he is doing well, stable, no other questions or concerns, satisfied with his progress.    INTERVAL HISTORY 10/24/2022: Patient presents today for follow-up, he is accompanied by wife.  Last visit was in May and at that time his diagnosis was mild cognitive impairment.  During this time we obtained the ATN profile which was positive for Alzheimer disease biomarker.  We obtained a brain MRI and PET amyloid which was positive for presence of amyloid.  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  He is interested in starting [place].  He feels like his memory since last visit is stable.  He does have trouble with remembering names of familiar persons, misplacing items and sometimes being forgetful.  Wife has been helping him.   HISTORY OF PRESENT ILLNESS:  This is a 73 year old gentleman past medical history of hypertension, hyperlipidemia, CAD, emphysema who is presenting with memory complaints. Memory problems described as trouble remembering names of friends that he has for more than 40 years.  He reports when driving he have to use the GPS even though he has been living in [place] for more than 50 years.  He denies being lost in familiar place and no recent accidents. He has to write everything  down or if he will forget.  He depends on the calendar otherwise he will miss his appointments.  He also have to write notes.  He feels like his wife has been complaining about his memory issues.  Other than the memory concern, patient is independent all activities of daily living.  He reports a strong family history of Alzheimer disease including his father and his uncles, his aunties and grand parents on his father side.   TBI:   No past history of TBI Stroke:    no past history of stroke Seizures:   no past history of seizures Sleep:  no history of sleep apnea.   Mood:  patient denies anxiety and depression Family history of Dementia: Father, uncles, sister and grandfather on father side  Functional status: independent in all ADLs and IADLs Patient lives with spouse at home. Cooking: no issues Cleaning: no issues Shopping: no issues Bathing: no issues  Toileting:  no issues  Driving: has to use GPS, no recent accident  Bills: No issues   Ever left the stove on by accident?: Denies   Forget how to use items around the house?: Denies Getting lost going to familiar places?: Denies  Forgetting loved ones names?: No  Word finding difficulty? No  Sleep: Perfect but in the last 2 weeks had sciatica which keeps him up all night   OTHER MEDICAL CONDITIONS: Hypertension, Hyperlipidemia, CAD, emphysema    REVIEW OF SYSTEMS: Full 14 system review of systems performed and negative with exception of: As noted  in the HPI   ALLERGIES: Allergies  Allergen Reactions   Hydrochlorothiazide Other (See Comments)    HOME MEDICATIONS: Outpatient Medications Prior to Visit  Medication Sig Dispense Refill   acetaminophen (TYLENOL) 500 MG tablet Take 1,000 mg by mouth every 6 (six) hours as needed.     aspirin EC 81 MG tablet Take 1 tablet (81 mg total) by mouth daily. Swallow whole. 90 tablet 3   losartan (COZAAR) 50 MG tablet Take 50 mg by mouth at bedtime.  0   Multiple Vitamins-Minerals  (MULTIVITAMIN MEN 50+ PO) Take by mouth.     Omega-3 Fatty Acids (FISH OIL) 1000 MG CAPS Take 1,000 mg by mouth daily.     rosuvastatin (CRESTOR) 40 MG tablet Take 1 tablet (40 mg total) by mouth daily. 90 tablet 3   TAMSULOSIN HCL PO Take 0.4 mg by mouth daily.     No facility-administered medications prior to visit.    PAST MEDICAL HISTORY: Past Medical History:  Diagnosis Date   Aortic atherosclerosis (HCC)    CAD (coronary artery disease)    Diverticulosis    Emphysema lung (HCC)    Hypercholesteremia    Hyperlipemia    Hypertension    Memory changes    Smoker    Tinnitus of both ears     PAST SURGICAL HISTORY: Past Surgical History:  Procedure Laterality Date   CARPAL TUNNEL RELEASE Right    HAND SURGERY     multiple reconstructive surgeries    HEMORRHOID SURGERY  01/23/2011   HEMORRHOID SURGERY     HERNIA REPAIR     LEG SURGERY      FAMILY HISTORY: Family History  Problem Relation Age of Onset   Alzheimer's disease Father    Alzheimer's disease Paternal Uncle    Alzheimer's disease Paternal Grandfather     SOCIAL HISTORY: Social History   Socioeconomic History   Marital status: Married    Spouse name: Not on file   Number of children: 3   Years of education: Not on file   Highest education level: Not on file  Occupational History   Not on file  Tobacco Use   Smoking status: Former    Current packs/day: 0.00    Types: Cigarettes    Quit date: 08/2015    Years since quitting: 7.7   Smokeless tobacco: Never  Vaping Use   Vaping status: Never Used  Substance and Sexual Activity   Alcohol use: Not Currently    Comment: weekly   Drug use: No   Sexual activity: Yes  Other Topics Concern   Not on file  Social History Narrative   Right handed   Caffeine-2 cups daily   Lives with wife   Social Drivers of Corporate investment banker Strain: Not on file  Food Insecurity: Not on file  Transportation Needs: Not on file  Physical Activity: Not on  file  Stress: Not on file  Social Connections: Not on file  Intimate Partner Violence: Not on file     PHYSICAL EXAM   GENERAL EXAM/CONSTITUTIONAL: Vitals:  Vitals:   05/01/23 0950  BP: 117/67  Pulse: 71  Weight: 224 lb 8 oz (101.8 kg)  Height: 5\' 10"  (1.778 m)    Body mass index is 32.21 kg/m. Wt Readings from Last 3 Encounters:  05/01/23 224 lb 8 oz (101.8 kg)  10/24/22 206 lb (93.4 kg)  08/13/22 203 lb 8 oz (92.3 kg)   Patient is in no distress; well developed, nourished  and groomed; neck is supple  MUSCULOSKELETAL: Gait, strength, tone, movements noted in Neurologic exam below  NEUROLOGIC: MENTAL STATUS:      No data to display            05/01/2023    9:58 AM 10/08/2022   11:42 AM 09/19/2022   10:11 AM 08/13/2022    9:03 AM  Montreal Cognitive Assessment   Visuospatial/ Executive (0/5) 5 5 5 5   Naming (0/3) 3 2 3 3   Attention: Read list of digits (0/2) 2 2 2 2   Attention: Read list of letters (0/1) 1 1 1 1   Attention: Serial 7 subtraction starting at 100 (0/3) 3 3 3 3   Language: Repeat phrase (0/2) 2 2 1 1   Language : Fluency (0/1) 1 1 1 1   Abstraction (0/2) 2 2 2 2   Delayed Recall (0/5) 3 3 3 2   Orientation (0/6) 6 6 6 6   Total 28 27 27 26   Adjusted Score (based on education)  27 27 26      CRANIAL NERVE:  2nd, 3rd, 4th, 6th- visual fields full to confrontation, extraocular muscles intact, no nystagmus 5th - facial sensation symmetric 7th - facial strength symmetric 8th - hearing intact 9th - palate elevates symmetrically, uvula midline 11th - shoulder shrug symmetric 12th - tongue protrusion midline  MOTOR:  normal bulk and tone, full strength in the BUE, BLE  SENSORY:  normal and symmetric to light touch  COORDINATION:  finger-nose-finger, fine finger movements normal  GAIT/STATION:  normal   DIAGNOSTIC DATA (LABS, IMAGING, TESTING) - I reviewed patient records, labs, notes, testing and imaging myself where available.  Lab  Results  Component Value Date   WBC 6.5 05/12/2016   HGB 15.8 05/12/2016   HCT 46.0 05/12/2016   MCV 85.0 05/12/2016   PLT 291 05/12/2016      Component Value Date/Time   NA 139 05/12/2016 0947   K 3.9 05/12/2016 0947   CL 104 05/12/2016 0947   CO2 27 05/12/2016 0947   GLUCOSE 122 (H) 05/12/2016 0947   BUN 15 05/12/2016 0947   CREATININE 0.93 05/12/2016 0947   CALCIUM 9.3 05/12/2016 0947   ALT 37 10/11/2021 0742   GFRNONAA >60 05/12/2016 0947   GFRAA >60 05/12/2016 0947   Lab Results  Component Value Date   CHOL 138 10/11/2021   HDL 55 10/11/2021   LDLCALC 61 10/11/2021   TRIG 122 10/11/2021   CHOLHDL 2.5 10/11/2021   No results found for: "HGBA1C" No results found for: "VITAMINB12" No results found for: "TSH"  HgA1C 6.1 TSH 0.92  ATN Profile: AB42/40 0.094 (low). Plasma NfL high, p Tau normal   ApoE4 Genotype: E3/E4   MRI Brain 09/24/2022 Brain volume was normal for age Some scattered T2/FLAIR hyperintense foci in the cerebral hemispheres with small confluencies in the periatrial white matter.  This is most consistent with mild chronic microvascular ischemic change, fairly typical for age and stable compared to the 06/09/2021 MRI. Susceptibility weighted images did not show any hemosiderin deposition.   No acute findings.   MRI Brain 04/24/2023 Stable MRI brain without contrast. No evidence of ARIA-E or ARIA-H.    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  ASSESSMENT AND PLAN  73 y.o. year old male with history of hypertension, hyperlipidemia, CAD, emphysema. 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  Overall he stable,  doing well, he has completed 5 Leqembi infusions, no side effect.  Plan will be to submit paperwork for medication approval, and will continue his infusion.  After his 6th infusion he will need a repeat MRI brain.  I  will see him in 1 year for follow-up or sooner if worse or any additional question or concerns.   1. 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)      Patient Instructions   Will continue current treatment with Leqembi  Call us for any questions of concerns  Return in a year of sooner if worse   No orders of the defined types were placed in this encounter.   No orders of the defined types were placed in this encounter.   Return in about 1 year (around 04/30/2024).   Windell Norfolk, MD 05/01/2023, 10:50 AM  [facility] [address], [address] [place], [place] [address] [phone]

## 2023-05-01 NOTE — Patient Instructions (Signed)
Will continue current treatment with Leqembi  Call us for any questions of concerns  Return in a year of sooner if worse

## 2023-05-08 DIAGNOSIS — M47812 Spondylosis without myelopathy or radiculopathy, cervical region: Secondary | ICD-10-CM | POA: Diagnosis not present

## 2023-05-08 DIAGNOSIS — M542 Cervicalgia: Secondary | ICD-10-CM | POA: Diagnosis not present

## 2023-05-13 ENCOUNTER — Telehealth: Payer: Self-pay

## 2023-05-20 DIAGNOSIS — Z006 Encounter for examination for normal comparison and control in clinical research program: Secondary | ICD-10-CM | POA: Diagnosis not present

## 2023-05-20 DIAGNOSIS — G3184 Mild cognitive impairment, so stated: Secondary | ICD-10-CM | POA: Diagnosis not present

## 2023-05-20 DIAGNOSIS — M542 Cervicalgia: Secondary | ICD-10-CM | POA: Diagnosis not present

## 2023-05-20 DIAGNOSIS — M436 Torticollis: Secondary | ICD-10-CM | POA: Diagnosis not present

## 2023-05-21 ENCOUNTER — Other Ambulatory Visit: Payer: Self-pay

## 2023-05-21 DIAGNOSIS — G309 Alzheimer's disease, unspecified: Secondary | ICD-10-CM

## 2023-05-22 ENCOUNTER — Encounter: Payer: Self-pay | Admitting: Neurology

## 2023-05-22 ENCOUNTER — Ambulatory Visit
Admission: RE | Admit: 2023-05-22 | Discharge: 2023-05-22 | Discharge status: Home / Self Care | Payer: Medicare HMO | Source: Ambulatory Visit | Attending: Neurology

## 2023-05-22 DIAGNOSIS — G309 Alzheimer's disease, unspecified: Secondary | ICD-10-CM | POA: Diagnosis not present

## 2023-05-22 DIAGNOSIS — G3184 Mild cognitive impairment, so stated: Secondary | ICD-10-CM | POA: Diagnosis not present

## 2023-05-29 DIAGNOSIS — M542 Cervicalgia: Secondary | ICD-10-CM | POA: Diagnosis not present

## 2023-05-29 DIAGNOSIS — M436 Torticollis: Secondary | ICD-10-CM | POA: Diagnosis not present

## 2023-06-03 DIAGNOSIS — Z006 Encounter for examination for normal comparison and control in clinical research program: Secondary | ICD-10-CM | POA: Diagnosis not present

## 2023-06-03 DIAGNOSIS — G3184 Mild cognitive impairment, so stated: Secondary | ICD-10-CM | POA: Diagnosis not present

## 2023-06-13 DIAGNOSIS — M542 Cervicalgia: Secondary | ICD-10-CM | POA: Diagnosis not present

## 2023-06-13 DIAGNOSIS — M436 Torticollis: Secondary | ICD-10-CM | POA: Diagnosis not present

## 2023-06-17 DIAGNOSIS — G3184 Mild cognitive impairment, so stated: Secondary | ICD-10-CM | POA: Diagnosis not present

## 2023-06-17 DIAGNOSIS — Z006 Encounter for examination for normal comparison and control in clinical research program: Secondary | ICD-10-CM | POA: Diagnosis not present

## 2023-06-18 DIAGNOSIS — M5412 Radiculopathy, cervical region: Secondary | ICD-10-CM | POA: Diagnosis not present

## 2023-06-18 DIAGNOSIS — M542 Cervicalgia: Secondary | ICD-10-CM | POA: Diagnosis not present

## 2023-07-08 DIAGNOSIS — E611 Iron deficiency: Secondary | ICD-10-CM | POA: Diagnosis not present

## 2023-07-16 DIAGNOSIS — Z006 Encounter for examination for normal comparison and control in clinical research program: Secondary | ICD-10-CM | POA: Diagnosis not present

## 2023-07-16 DIAGNOSIS — G3184 Mild cognitive impairment, so stated: Secondary | ICD-10-CM | POA: Diagnosis not present

## 2023-07-29 ENCOUNTER — Other Ambulatory Visit: Payer: Self-pay | Admitting: Neurology

## 2023-07-29 DIAGNOSIS — G3184 Mild cognitive impairment, so stated: Secondary | ICD-10-CM

## 2023-07-30 DIAGNOSIS — G3184 Mild cognitive impairment, so stated: Secondary | ICD-10-CM | POA: Diagnosis not present

## 2023-07-30 DIAGNOSIS — Z006 Encounter for examination for normal comparison and control in clinical research program: Secondary | ICD-10-CM | POA: Diagnosis not present

## 2023-08-02 ENCOUNTER — Encounter: Payer: Self-pay | Admitting: Neurology

## 2023-08-08 ENCOUNTER — Ambulatory Visit
Admission: RE | Admit: 2023-08-08 | Discharge: 2023-08-08 | Disposition: A | Discharge status: Home / Self Care | Source: Ambulatory Visit | Attending: Neurology | Admitting: Neurology

## 2023-08-08 DIAGNOSIS — G309 Alzheimer's disease, unspecified: Secondary | ICD-10-CM | POA: Diagnosis not present

## 2023-08-08 DIAGNOSIS — G3184 Mild cognitive impairment, so stated: Secondary | ICD-10-CM | POA: Diagnosis not present

## 2023-08-12 ENCOUNTER — Other Ambulatory Visit: Payer: Self-pay | Admitting: Neurology

## 2023-08-12 MED ORDER — LEQEMBI 200 MG/2ML IV SOLN: 10.0000 mg/kg | INTRAVENOUS | Status: AC

## 2023-08-13 DIAGNOSIS — L237 Allergic contact dermatitis due to plants, except food: Secondary | ICD-10-CM | POA: Diagnosis not present

## 2023-08-13 DIAGNOSIS — R7303 Prediabetes: Secondary | ICD-10-CM | POA: Diagnosis not present

## 2023-08-13 DIAGNOSIS — Z006 Encounter for examination for normal comparison and control in clinical research program: Secondary | ICD-10-CM | POA: Diagnosis not present

## 2023-08-13 DIAGNOSIS — G3184 Mild cognitive impairment, so stated: Secondary | ICD-10-CM | POA: Diagnosis not present

## 2023-08-14 ENCOUNTER — Encounter: Payer: Self-pay | Admitting: Neurology

## 2023-08-27 ENCOUNTER — Telehealth: Payer: Self-pay

## 2023-08-27 ENCOUNTER — Other Ambulatory Visit: Payer: Self-pay | Admitting: Neurology

## 2023-08-27 DIAGNOSIS — G3184 Mild cognitive impairment, so stated: Secondary | ICD-10-CM | POA: Diagnosis not present

## 2023-08-27 DIAGNOSIS — Z006 Encounter for examination for normal comparison and control in clinical research program: Secondary | ICD-10-CM | POA: Diagnosis not present

## 2023-08-27 NOTE — Telephone Encounter (Signed)
 sent to GI they obtain [facility] Berkley Harvey [phone]

## 2023-08-27 NOTE — Telephone Encounter (Signed)
 Notified by infusion suite patient here receiving 13th infusion, will need MRI read before 6/3

## 2023-09-04 ENCOUNTER — Other Ambulatory Visit

## 2023-09-07 DIAGNOSIS — J439 Emphysema, unspecified: Secondary | ICD-10-CM | POA: Diagnosis not present

## 2023-09-07 DIAGNOSIS — I2510 Atherosclerotic heart disease of native coronary artery without angina pectoris: Secondary | ICD-10-CM | POA: Diagnosis not present

## 2023-09-07 DIAGNOSIS — E7800 Pure hypercholesterolemia, unspecified: Secondary | ICD-10-CM | POA: Diagnosis not present

## 2023-09-07 DIAGNOSIS — I10 Essential (primary) hypertension: Secondary | ICD-10-CM | POA: Diagnosis not present

## 2023-09-09 ENCOUNTER — Ambulatory Visit: Payer: Self-pay | Admitting: Neurology

## 2023-09-09 ENCOUNTER — Ambulatory Visit
Admission: RE | Admit: 2023-09-09 | Discharge: 2023-09-09 | Disposition: A | Discharge status: Home / Self Care | Source: Ambulatory Visit | Attending: Neurology | Admitting: Neurology

## 2023-09-09 DIAGNOSIS — G3184 Mild cognitive impairment, so stated: Secondary | ICD-10-CM

## 2023-09-09 DIAGNOSIS — G309 Alzheimer's disease, unspecified: Secondary | ICD-10-CM

## 2023-09-16 DIAGNOSIS — H35373 Puckering of macula, bilateral: Secondary | ICD-10-CM | POA: Diagnosis not present

## 2023-09-18 ENCOUNTER — Ambulatory Visit (HOSPITAL_COMMUNITY)
Admission: RE | Admit: 2023-09-18 | Discharge: 2023-09-18 | Disposition: A | Discharge status: Home / Self Care | Source: Ambulatory Visit | Attending: Acute Care | Admitting: Acute Care

## 2023-09-18 DIAGNOSIS — Z87891 Personal history of nicotine dependence: Secondary | ICD-10-CM | POA: Insufficient documentation

## 2023-09-18 DIAGNOSIS — Z122 Encounter for screening for malignant neoplasm of respiratory organs: Secondary | ICD-10-CM | POA: Diagnosis present

## 2023-09-24 DIAGNOSIS — Z08 Encounter for follow-up examination after completed treatment for malignant neoplasm: Secondary | ICD-10-CM | POA: Diagnosis not present

## 2023-09-24 DIAGNOSIS — R208 Other disturbances of skin sensation: Secondary | ICD-10-CM | POA: Diagnosis not present

## 2023-09-24 DIAGNOSIS — L853 Xerosis cutis: Secondary | ICD-10-CM | POA: Diagnosis not present

## 2023-09-24 DIAGNOSIS — L814 Other melanin hyperpigmentation: Secondary | ICD-10-CM | POA: Diagnosis not present

## 2023-09-24 DIAGNOSIS — D1801 Hemangioma of skin and subcutaneous tissue: Secondary | ICD-10-CM | POA: Diagnosis not present

## 2023-09-24 DIAGNOSIS — L820 Inflamed seborrheic keratosis: Secondary | ICD-10-CM | POA: Diagnosis not present

## 2023-09-24 DIAGNOSIS — L538 Other specified erythematous conditions: Secondary | ICD-10-CM | POA: Diagnosis not present

## 2023-09-24 DIAGNOSIS — Z789 Other specified health status: Secondary | ICD-10-CM | POA: Diagnosis not present

## 2023-09-24 DIAGNOSIS — L2989 Other pruritus: Secondary | ICD-10-CM | POA: Diagnosis not present

## 2023-09-24 DIAGNOSIS — L821 Other seborrheic keratosis: Secondary | ICD-10-CM | POA: Diagnosis not present

## 2023-09-24 DIAGNOSIS — Z85828 Personal history of other malignant neoplasm of skin: Secondary | ICD-10-CM | POA: Diagnosis not present

## 2023-09-30 ENCOUNTER — Other Ambulatory Visit: Payer: Self-pay

## 2023-09-30 DIAGNOSIS — Z87891 Personal history of nicotine dependence: Secondary | ICD-10-CM

## 2023-09-30 DIAGNOSIS — Z122 Encounter for screening for malignant neoplasm of respiratory organs: Secondary | ICD-10-CM

## 2023-10-07 DIAGNOSIS — I2510 Atherosclerotic heart disease of native coronary artery without angina pectoris: Secondary | ICD-10-CM | POA: Diagnosis not present

## 2023-10-07 DIAGNOSIS — E7800 Pure hypercholesterolemia, unspecified: Secondary | ICD-10-CM | POA: Diagnosis not present

## 2023-10-07 DIAGNOSIS — J439 Emphysema, unspecified: Secondary | ICD-10-CM | POA: Diagnosis not present

## 2023-10-07 DIAGNOSIS — I10 Essential (primary) hypertension: Secondary | ICD-10-CM | POA: Diagnosis not present

## 2023-10-08 DIAGNOSIS — Z006 Encounter for examination for normal comparison and control in clinical research program: Secondary | ICD-10-CM | POA: Diagnosis not present

## 2023-10-08 DIAGNOSIS — G3184 Mild cognitive impairment, so stated: Secondary | ICD-10-CM | POA: Diagnosis not present

## 2023-10-25 ENCOUNTER — Ambulatory Visit: Admitting: Neurology

## 2023-10-29 NOTE — Progress Notes (Unsigned)
 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   HISTORICAL  CHIEF COMPLAINT:  Chief Complaint  Patient presents with   Follow-up    Pt in room 15. Alone.Here for memory follow up. Pt said he thinks memory is stable, reads a lot, uses GPS when driving. MOCA:27   Update 10/30/23 SS: MOCA 27/30. Last Leqembi  was 10/22/23 infusion # 17. He is not aware of any memory deterioration. Driving, reading, volunteering. Extensive family history of AD. Has gained some weight over the last year. Just got back from the beach. 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  MRI of the brain in February, May, June 2025 did not show any evidence of ARIA. Functional Activities Questionnaire: 0.  INTERVAL HISTORY 05/01/2023:  Patient presents tday for follow-up, last visit was in July, since then he has been doing well.  He has started Leqembi  treatment, so far completed 5 infusions.  He denies any side effect from the infusion.  His surveillance MRI has been negative for any side effect of the Leqembi  including brain bleeding or brain swelling.  Overall he is doing well, stable, no other questions or concerns, satisfied with his progress.   INTERVAL HISTORY 10/24/2022: Patient presents today for follow-up, he is accompanied by wife.  Last visit was in May and at that time his diagnosis was mild cognitive impairment.  During this time we obtained the ATN profile which was positive for Alzheimer disease biomarker.  We obtained a brain MRI and PET amyloid which was positive for presence of amyloid.  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  He is interested in starting Leqembi .  He feels like his memory since last visit is stable.  He does have trouble with remembering names of familiar persons, misplacing items and sometimes being  forgetful.  Wife has been helping him.   HISTORY OF PRESENT ILLNESS:  This is a 73 year old gentleman past medical history of hypertension, hyperlipidemia, CAD, emphysema who is presenting with memory complaints. Memory problems described as trouble remembering names of friends that he has for more than 40 years.  He reports when driving he have to use the GPS even though he has been living in [place] for more than 50 years.  He denies being lost in familiar place and no recent accidents. He has to write everything down or if he will forget.  He depends on the calendar otherwise he will miss his appointments.  He also have to write notes.  He feels like his wife has been complaining about his memory issues.  Other than the memory concern, patient is independent all activities of daily living.  He reports a strong family history of Alzheimer disease including his father and his uncles, his aunties and grand parents on his father side.   TBI:   No past history of TBI Stroke:    no past history of stroke Seizures:   no past history of seizures Sleep:  no history of sleep apnea.   Mood:  patient denies anxiety and depression Family history of Dementia: Father, uncles, sister and grandfather on father side  Functional status: independent in all ADLs and IADLs Patient lives with spouse at home. Cooking: no issues Cleaning: no issues Shopping: no issues Bathing: no issues  Toileting:  no issues  Driving: has to use GPS, no recent accident  Bills: No issues   Ever left the stove on by accident?: Denies   Forget how to use items around the house?: Denies Getting lost going to familiar places?: Denies  Forgetting loved ones names?: No  Word finding difficulty? No  Sleep: Perfect but in the last 2 weeks had sciatica which keeps him up all night   OTHER MEDICAL CONDITIONS: Hypertension, Hyperlipidemia, CAD, emphysema    REVIEW OF SYSTEMS: Full 14 system review of systems performed and  negative with exception of: As noted in the HPI   ALLERGIES: Allergies  Allergen Reactions   Hydrochlorothiazide Other (See Comments)    HOME MEDICATIONS: Outpatient Medications Prior to Visit  Medication Sig Dispense Refill   acetaminophen (TYLENOL) 500 MG tablet Take 1,000 mg by mouth every 6 (six) hours as needed.     aspirin  EC 81 MG tablet Take 1 tablet (81 mg total) by mouth daily. Swallow whole. 90 tablet 3   Lecanemab -irmb (LEQEMBI ) 200 MG/2ML SOLN Inject 10 mg/kg into the vein every 14 (fourteen) days.     losartan (COZAAR) 50 MG tablet Take 50 mg by mouth at bedtime.  0   Multiple Vitamins-Minerals (MULTIVITAMIN MEN 50+ PO) Take by mouth.     Omega-3 Fatty Acids (FISH OIL) 1000 MG CAPS Take 1,000 mg by mouth daily.     rosuvastatin  (CRESTOR ) 40 MG tablet Take 1 tablet (40 mg total) by mouth daily. 90 tablet 3   TAMSULOSIN HCL PO Take 0.4 mg by mouth daily.     No facility-administered medications prior to visit.    PAST MEDICAL HISTORY: Past Medical History:  Diagnosis Date   Aortic atherosclerosis (HCC)    CAD (coronary artery disease)    Diverticulosis    Emphysema lung (HCC)    Hypercholesteremia    Hyperlipemia    Hypertension    Memory changes    Smoker    Tinnitus of both ears     PAST SURGICAL HISTORY: Past Surgical History:  Procedure Laterality Date   CARPAL TUNNEL RELEASE Right    HAND SURGERY     multiple reconstructive surgeries    HEMORRHOID SURGERY  01/23/2011   HEMORRHOID SURGERY     HERNIA REPAIR     LEG SURGERY      FAMILY HISTORY: Family History  Problem Relation Age of Onset   Alzheimer's disease Father    Alzheimer's disease Paternal Uncle    Alzheimer's disease Paternal Grandfather     SOCIAL HISTORY: Social History   Socioeconomic History   Marital status: Married    Spouse name: Not on file   Number of children: 3   Years of education: Not on file   Highest education level: Not on file  Occupational History   Not on  file  Tobacco Use   Smoking status: Former    Current packs/day: 0.00    Types: Cigarettes    Quit date: 08/2015    Years since quitting: 8.2   Smokeless tobacco: Never  Vaping Use   Vaping status: Never Used  Substance and Sexual Activity   Alcohol use: Yes    Comment: 3 drinks per week   Drug use: No   Sexual activity: Yes  Other Topics Concern   Not on file  Social History Narrative   Right handed   Caffeine-2 cups daily   Lives with wife   Social Drivers of Corporate investment banker Strain: Not on file  Food Insecurity: Not on file  Transportation Needs: Not on  file  Physical Activity: Not on file  Stress: Not on file  Social Connections: Not on file  Intimate Partner Violence: Not on file   PHYSICAL EXAM  Physical Exam  General: The patient is alert and cooperative at the time of the examination.  Skin: No significant peripheral edema is noted.  Neurologic Exam  Mental status: The patient is alert and oriented x 3 at the time of the examination. The patient has apparent normal recent and remote memory, with an apparently normal attention span and concentration ability. MOCA 27/30.   Cranial nerves: Facial symmetry is present. Speech is normal, no aphasia or dysarthria is noted. Extraocular movements are full. Visual fields are full.  Motor: The patient has good strength in all 4 extremities.  Sensory examination: Soft touch sensation is symmetric on the face, arms, and legs.  Coordination: The patient has good finger-nose-finger and heel-to-shin bilaterally, but has left hand deformity   Gait and station: The patient has a normal gait.   Reflexes: Deep tendon reflexes are symmetric.  GENERAL EXAM/CONSTITUTIONAL: Vitals:  Vitals:   10/30/23 0803  BP: 129/77  Pulse: 76  Weight: 222 lb 12.8 oz (101.1 kg)  Height: 5' 10 (1.778 m)     Body mass index is 31.97 kg/m. Wt Readings from Last 3 Encounters:  10/30/23 222 lb 12.8 oz (101.1 kg)   05/01/23 224 lb 8 oz (101.8 kg)  10/24/22 206 lb (93.4 kg)        No data to display            10/30/2023    8:06 AM 05/01/2023    9:58 AM 10/08/2022   11:42 AM 09/19/2022   10:11 AM 08/13/2022    9:03 AM  Montreal Cognitive Assessment   Visuospatial/ Executive (0/5) 4 5 5 5 5   Naming (0/3) 3 3 2 3 3   Attention: Read list of digits (0/2) 2 2 2 2 2   Attention: Read list of letters (0/1) 1 1 1 1 1   Attention: Serial 7 subtraction starting at 100 (0/3) 3 3 3 3 3   Language: Repeat phrase (0/2) 2 2 2 1 1   Language : Fluency (0/1) 1 1 1 1 1   Abstraction (0/2) 2 2 2 2 2   Delayed Recall (0/5) 3 3 3 3 2   Orientation (0/6) 6 6 6 6 6   Total 27 28 27 27 26   Adjusted Score (based on education)   27 27 26    DIAGNOSTIC DATA (LABS, IMAGING, TESTING) - I reviewed patient records, labs, notes, testing and imaging myself where available.  Lab Results  Component Value Date   WBC 6.5 05/12/2016   HGB 15.8 05/12/2016   HCT 46.0 05/12/2016   MCV 85.0 05/12/2016   PLT 291 05/12/2016      Component Value Date/Time   NA 139 05/12/2016 0947   K 3.9 05/12/2016 0947   CL 104 05/12/2016 0947   CO2 27 05/12/2016 0947   GLUCOSE 122 (H) 05/12/2016 0947   BUN 15 05/12/2016 0947   CREATININE 0.93 05/12/2016 0947   CALCIUM  9.3 05/12/2016 0947   ALT 37 10/11/2021 0742   GFRNONAA >60 05/12/2016 0947   GFRAA >60 05/12/2016 0947   Lab Results  Component Value Date   CHOL 138 10/11/2021   HDL 55 10/11/2021   LDLCALC 61 10/11/2021   TRIG 122 10/11/2021   CHOLHDL 2.5 10/11/2021   No results found for: HGBA1C No results found for: VITAMINB12 No results found for: TSH  HgA1C 6.1  TSH 0.92  ATN Profile: AB42/40 0.094 (low). Plasma NfL high, p Tau normal   ApoE4 Genotype: E3/E4   MRI Brain 09/24/2022 Brain volume was normal for age Some scattered T2/FLAIR hyperintense foci in the cerebral hemispheres with small confluencies in the periatrial white matter.  This is most consistent with  mild chronic microvascular ischemic change, fairly typical for age and stable compared to the 06/09/2021 MRI. Susceptibility weighted images did not show any hemosiderin deposition.   No acute findings.   MRI Brain 04/24/2023 Stable MRI brain without contrast. No evidence of ARIA-E or ARIA-H.   MRI of the brain 05/22/23 Stable brain without contrast.  No evidence of ARIA-E or ARIA-H.  MRI of the brain 08/08/2023 Stable brain without contrast.  No evidence of ARIA-E or ARIA-H.  MRI of the brain 09/09/2023 Stable brain without contrast.  No evidence of ARIA-E or ARIA-H.  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  ASSESSMENT AND PLAN  73 y.o. year old male with history of hypertension, hyperlipidemia, CAD, emphysema. 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  He remains on Leqembi .  Initial infusion 01/10/23, he has been receiving bimonthly infusions every 2 weeks.  Most recent was 10/22/23 infusion # 17.  MRI of the brain was last done in June without evidence of ARIA-E or ARIA-H. His MOCA was stable 27/30.  He will schedule for a visit in January 2026 with Dr. Gregg. Functional Activities Questionnaire was 0.   Lauraine Gayland MANDES, DNP  [facility] [address] [place], [place] [address] [phone]

## 2023-10-30 ENCOUNTER — Encounter: Payer: Self-pay | Admitting: Neurology

## 2023-10-30 ENCOUNTER — Ambulatory Visit: Admitting: Neurology

## 2023-10-30 VITALS — BP 129/77 | HR 76 | Ht 70.0 in | Wt 222.8 lb

## 2023-10-30 DIAGNOSIS — G300 Alzheimer's disease with early onset: Secondary | ICD-10-CM

## 2023-10-30 DIAGNOSIS — F02A0 Dementia in other diseases classified elsewhere, mild, without behavioral disturbance, psychotic disturbance, mood disturbance, and anxiety: Secondary | ICD-10-CM

## 2023-10-30 DIAGNOSIS — F0280 Dementia in other diseases classified elsewhere without behavioral disturbance: Secondary | ICD-10-CM | POA: Insufficient documentation

## 2023-10-30 NOTE — Patient Instructions (Addendum)
 Great to see you today. Continue Leqembi  infusions. Call for any headache, weakness or other concerning symptom. Stay active, exercise. Follow up in 1 year.

## 2023-11-07 DIAGNOSIS — I2510 Atherosclerotic heart disease of native coronary artery without angina pectoris: Secondary | ICD-10-CM | POA: Diagnosis not present

## 2023-11-07 DIAGNOSIS — E7800 Pure hypercholesterolemia, unspecified: Secondary | ICD-10-CM | POA: Diagnosis not present

## 2023-11-07 DIAGNOSIS — I10 Essential (primary) hypertension: Secondary | ICD-10-CM | POA: Diagnosis not present

## 2023-11-07 DIAGNOSIS — J439 Emphysema, unspecified: Secondary | ICD-10-CM | POA: Diagnosis not present

## 2023-11-20 DIAGNOSIS — G3184 Mild cognitive impairment, so stated: Secondary | ICD-10-CM | POA: Diagnosis not present

## 2023-11-20 DIAGNOSIS — Z006 Encounter for examination for normal comparison and control in clinical research program: Secondary | ICD-10-CM | POA: Diagnosis not present

## 2023-12-02 ENCOUNTER — Telehealth: Payer: Self-pay | Admitting: Neurology

## 2023-12-02 NOTE — Telephone Encounter (Signed)
 LVM and sent mychart msg informing pt of appointment change - sooner appt available

## 2023-12-08 DIAGNOSIS — J439 Emphysema, unspecified: Secondary | ICD-10-CM | POA: Diagnosis not present

## 2023-12-08 DIAGNOSIS — E7800 Pure hypercholesterolemia, unspecified: Secondary | ICD-10-CM | POA: Diagnosis not present

## 2023-12-08 DIAGNOSIS — I10 Essential (primary) hypertension: Secondary | ICD-10-CM | POA: Diagnosis not present

## 2023-12-08 DIAGNOSIS — I2510 Atherosclerotic heart disease of native coronary artery without angina pectoris: Secondary | ICD-10-CM | POA: Diagnosis not present

## 2023-12-24 ENCOUNTER — Other Ambulatory Visit: Payer: Self-pay | Admitting: Medical Genetics

## 2023-12-25 DIAGNOSIS — Z006 Encounter for examination for normal comparison and control in clinical research program: Secondary | ICD-10-CM | POA: Diagnosis not present

## 2023-12-25 DIAGNOSIS — G3184 Mild cognitive impairment, so stated: Secondary | ICD-10-CM | POA: Diagnosis not present

## 2023-12-26 ENCOUNTER — Encounter: Payer: Self-pay | Admitting: Neurology

## 2023-12-26 ENCOUNTER — Other Ambulatory Visit

## 2023-12-26 DIAGNOSIS — Z006 Encounter for examination for normal comparison and control in clinical research program: Secondary | ICD-10-CM

## 2024-01-07 DIAGNOSIS — E7800 Pure hypercholesterolemia, unspecified: Secondary | ICD-10-CM | POA: Diagnosis not present

## 2024-01-07 DIAGNOSIS — J439 Emphysema, unspecified: Secondary | ICD-10-CM | POA: Diagnosis not present

## 2024-01-07 DIAGNOSIS — I10 Essential (primary) hypertension: Secondary | ICD-10-CM | POA: Diagnosis not present

## 2024-01-07 DIAGNOSIS — I2510 Atherosclerotic heart disease of native coronary artery without angina pectoris: Secondary | ICD-10-CM | POA: Diagnosis not present

## 2024-01-08 DIAGNOSIS — Z006 Encounter for examination for normal comparison and control in clinical research program: Secondary | ICD-10-CM | POA: Diagnosis not present

## 2024-01-08 DIAGNOSIS — G3184 Mild cognitive impairment, so stated: Secondary | ICD-10-CM | POA: Diagnosis not present

## 2024-01-09 DIAGNOSIS — D649 Anemia, unspecified: Secondary | ICD-10-CM | POA: Diagnosis not present

## 2024-01-10 LAB — GENECONNECT MOLECULAR SCREEN: Genetic Analysis Overall Interpretation: NEGATIVE

## 2024-01-22 DIAGNOSIS — G3184 Mild cognitive impairment, so stated: Secondary | ICD-10-CM | POA: Diagnosis not present

## 2024-01-22 DIAGNOSIS — Z006 Encounter for examination for normal comparison and control in clinical research program: Secondary | ICD-10-CM | POA: Diagnosis not present

## 2024-02-06 DIAGNOSIS — R0789 Other chest pain: Secondary | ICD-10-CM | POA: Diagnosis not present

## 2024-02-06 DIAGNOSIS — R051 Acute cough: Secondary | ICD-10-CM | POA: Diagnosis not present

## 2024-02-06 DIAGNOSIS — J069 Acute upper respiratory infection, unspecified: Secondary | ICD-10-CM | POA: Diagnosis not present

## 2024-02-07 DIAGNOSIS — I10 Essential (primary) hypertension: Secondary | ICD-10-CM | POA: Diagnosis not present

## 2024-02-07 DIAGNOSIS — J439 Emphysema, unspecified: Secondary | ICD-10-CM | POA: Diagnosis not present

## 2024-02-07 DIAGNOSIS — E7800 Pure hypercholesterolemia, unspecified: Secondary | ICD-10-CM | POA: Diagnosis not present

## 2024-02-07 DIAGNOSIS — I2510 Atherosclerotic heart disease of native coronary artery without angina pectoris: Secondary | ICD-10-CM | POA: Diagnosis not present

## 2024-02-11 DIAGNOSIS — J069 Acute upper respiratory infection, unspecified: Secondary | ICD-10-CM | POA: Diagnosis not present

## 2024-02-11 DIAGNOSIS — R053 Chronic cough: Secondary | ICD-10-CM | POA: Diagnosis not present

## 2024-02-19 DIAGNOSIS — Z006 Encounter for examination for normal comparison and control in clinical research program: Secondary | ICD-10-CM | POA: Diagnosis not present

## 2024-02-19 DIAGNOSIS — G3184 Mild cognitive impairment, so stated: Secondary | ICD-10-CM | POA: Diagnosis not present

## 2024-03-04 DIAGNOSIS — Z006 Encounter for examination for normal comparison and control in clinical research program: Secondary | ICD-10-CM | POA: Diagnosis not present

## 2024-03-04 DIAGNOSIS — G3184 Mild cognitive impairment, so stated: Secondary | ICD-10-CM | POA: Diagnosis not present

## 2024-04-22 ENCOUNTER — Ambulatory Visit: Admitting: Neurology

## 2024-04-23 ENCOUNTER — Encounter: Payer: Self-pay | Admitting: Neurology

## 2024-04-23 ENCOUNTER — Ambulatory Visit: Admitting: Neurology

## 2024-04-23 VITALS — BP 130/72 | HR 97 | Ht 70.0 in | Wt 225.0 lb

## 2024-04-23 DIAGNOSIS — F02A0 Dementia in other diseases classified elsewhere, mild, without behavioral disturbance, psychotic disturbance, mood disturbance, and anxiety: Secondary | ICD-10-CM

## 2024-04-23 DIAGNOSIS — G300 Alzheimer's disease with early onset: Secondary | ICD-10-CM | POA: Diagnosis not present

## 2024-04-23 NOTE — Progress Notes (Signed)
 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   HISTORICAL  CHIEF COMPLAINT:  Chief Complaint  Patient presents with   Follow-up    Room 1-Moca completed: 28 With wife Everything has been great   Update 04/23/24 SS: Here with wife, [name]. MOCA 28/30. Had Leqembi  infusion yesterday. Continues with every 2 week infusion. Around April/May will be 18 months on infusion. He remains active, independent. Drives. Continues with trouble with names. Still short term memory, overall stable, no decline. Play golf, watches jeopardy every night. Functional Activities Questionnaire: 0.   Update 10/30/23 SS: MOCA 27/30. Last Leqembi  was 10/22/23 infusion # 17. He is not aware of any memory deterioration. Driving, reading, volunteering. Extensive family history of AD. Has gained some weight over the last year. Just got back from the beach. 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  MRI of the brain in February, May, June 2025 did not show any evidence of ARIA. Functional Activities Questionnaire: 0.  INTERVAL HISTORY 05/01/2023:  Patient presents tday for follow-up, last visit was in July, since then he has been doing well.  He has started Leqembi  treatment, so far completed 5 infusions.  He denies any side effect from the infusion.  His surveillance MRI has been negative for any side effect of the Leqembi  including brain bleeding or brain swelling.  Overall he is doing well, stable, no other questions or concerns, satisfied with his progress.   INTERVAL HISTORY 10/24/2022: Patient presents today for follow-up, he is accompanied by wife.  Last visit was in May and at that time his diagnosis was mild cognitive impairment.  During this time we obtained the ATN profile which was positive for Alzheimer disease biomarker.  We  obtained a brain MRI and PET amyloid which was positive for presence of amyloid.  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  He is interested in starting Leqembi .  He feels like his memory since last visit is stable.  He does have trouble with remembering names of familiar persons, misplacing items and sometimes being forgetful.  Wife has been helping him.   HISTORY OF PRESENT ILLNESS:  This is a 74 year old gentleman past medical history of hypertension, hyperlipidemia, CAD, emphysema who is presenting with memory complaints. Memory problems described as trouble remembering names of friends that he has for more than 40 years.  He reports when driving he have to use the GPS even though he has been living in [place] for more than 50 years.  He denies being lost in familiar place and no recent accidents. He has to write everything down or if he will forget.  He depends on the calendar otherwise he will miss his appointments.  He also have to write notes.  He feels like his wife has been complaining about his memory issues.  Other than the memory concern, patient is independent all activities of daily living.  He reports a strong family history of Alzheimer disease including his father and his uncles, his aunties and grand parents on his father side.   TBI:   No past history of TBI Stroke:    no past history of stroke Seizures:   no past history of seizures Sleep:  no history of sleep apnea.   Mood:  patient denies anxiety and depression Family history of Dementia: Father, uncles, sister and  grandfather on father side  Functional status: independent in all ADLs and IADLs Patient lives with spouse at home. Cooking: no issues Cleaning: no issues Shopping: no issues Bathing: no issues  Toileting:  no issues  Driving: has to use GPS, no recent accident  Bills: No issues   Ever left the stove on by accident?: Denies   Forget how to use items around the house?: Denies Getting lost going  to familiar places?: Denies  Forgetting loved ones names?: No  Word finding difficulty? No  Sleep: Perfect but in the last 2 weeks had sciatica which keeps him up all night   OTHER MEDICAL CONDITIONS: Hypertension, Hyperlipidemia, CAD, emphysema    REVIEW OF SYSTEMS: Full 14 system review of systems performed and negative with exception of: As noted in the HPI   ALLERGIES: Allergies  Allergen Reactions   Hydrochlorothiazide Other (See Comments)    HOME MEDICATIONS: Outpatient Medications Prior to Visit  Medication Sig Dispense Refill   acetaminophen (TYLENOL) 500 MG tablet Take 1,000 mg by mouth every 6 (six) hours as needed.     aspirin  EC 81 MG tablet Take 1 tablet (81 mg total) by mouth daily. Swallow whole. 90 tablet 3   fluticasone (FLONASE) 50 MCG/ACT nasal spray Place 2 sprays into both nostrils daily.     GEMTESA 75 MG TABS Take 1 tablet by mouth daily.     Iron-Vit C-Vit B12-Folic Acid (IRON 100 PLUS) 100-250-0.025-1 MG TABS      Lecanemab -irmb (LEQEMBI ) 200 MG/2ML SOLN Inject 10 mg/kg into the vein every 14 (fourteen) days.     losartan (COZAAR) 50 MG tablet Take 50 mg by mouth at bedtime.  0   Multiple Vitamins-Minerals (MULTIVITAMIN MEN 50+ PO) Take by mouth.     Omega-3 Fatty Acids (FISH OIL) 1000 MG CAPS Take 1,000 mg by mouth daily.     Omega-3 Fatty Acids (FISH OIL) 1000 MG CAPS 1 capsule.     rosuvastatin  (CRESTOR ) 40 MG tablet Take 1 tablet (40 mg total) by mouth daily. 90 tablet 3   TAMSULOSIN HCL PO Take 0.4 mg by mouth daily.     No facility-administered medications prior to visit.    PAST MEDICAL HISTORY: Past Medical History:  Diagnosis Date   Aortic atherosclerosis    CAD (coronary artery disease)    Diverticulosis    Emphysema lung (HCC)    Hypercholesteremia    Hyperlipemia    Hypertension    Memory changes    Smoker    Tinnitus of both ears     PAST SURGICAL HISTORY: Past Surgical History:  Procedure Laterality Date   CARPAL TUNNEL  RELEASE Right    HAND SURGERY     multiple reconstructive surgeries    HEMORRHOID SURGERY  01/23/2011   HEMORRHOID SURGERY     HERNIA REPAIR     LEG SURGERY      FAMILY HISTORY: Family History  Problem Relation Age of Onset   Alzheimer's disease Father    Alzheimer's disease Paternal Uncle    Alzheimer's disease Paternal Grandfather     SOCIAL HISTORY: Social History   Socioeconomic History   Marital status: Married    Spouse name: Not on file   Number of children: 3   Years of education: Not on file   Highest education level: Not on file  Occupational History   Not on file  Tobacco Use   Smoking status: Former    Current packs/day: 0.00    Types: Cigarettes  Quit date: 08/2015    Years since quitting: 8.7   Smokeless tobacco: Never  Vaping Use   Vaping status: Never Used  Substance and Sexual Activity   Alcohol use: Yes    Comment: 3 drinks per week   Drug use: No   Sexual activity: Yes  Other Topics Concern   Not on file  Social History Narrative   Right handed   Caffeine-2 cups daily   Lives with wife   Social Drivers of Health   Tobacco Use: Medium Risk (04/23/2024)   Patient History    Smoking Tobacco Use: Former    Smokeless Tobacco Use: Never    Passive Exposure: Not on Actuary Strain: Not on file  Food Insecurity: Not on file  Transportation Needs: Not on file  Physical Activity: Not on file  Stress: Not on file  Social Connections: Not on file  Intimate Partner Violence: Not on file  Depression (EYV7-0): Not on file  Alcohol Screen: Not on file  Housing: Not on file  Utilities: Not on file  Health Literacy: Not on file   PHYSICAL EXAM  Physical Exam  General: The patient is alert and cooperative at the time of the examination.  Skin: No significant peripheral edema is noted.  Neurologic Exam  Mental status: The patient is alert and oriented x 3 at the time of the examination. The patient has apparent normal  recent and remote memory, with an apparently normal attention span and concentration ability. MOCA 28/30.   Cranial nerves: Facial symmetry is present. Speech is normal, no aphasia or dysarthria is noted. Extraocular movements are full. Visual fields are full.  Motor: The patient has good strength in all 4 extremities.  Sensory examination: Soft touch sensation is symmetric on the face, arms, and legs.  Coordination: The patient has good finger-nose-finger and heel-to-shin bilaterally, but has left hand deformity   Gait and station: The patient has a normal gait.   GENERAL EXAM/CONSTITUTIONAL: Vitals:  Vitals:   04/23/24 1051  BP: 130/72  Pulse: 97  SpO2: 98%  Weight: 225 lb (102.1 kg)  Height: 5' 10 (1.778 m)      Body mass index is 32.28 kg/m. Wt Readings from Last 3 Encounters:  04/23/24 225 lb (102.1 kg)  10/30/23 222 lb 12.8 oz (101.1 kg)  05/01/23 224 lb 8 oz (101.8 kg)        No data to display            04/23/2024   10:58 AM 10/30/2023    8:06 AM 05/01/2023    9:58 AM 10/08/2022   11:42 AM 09/19/2022   10:11 AM  Montreal Cognitive Assessment   Visuospatial/ Executive (0/5) 5 4 5 5 5   Naming (0/3) 3 3 3 2 3   Attention: Read list of digits (0/2) 2 2 2 2 2   Attention: Read list of letters (0/1) 1 1 1 1 1   Attention: Serial 7 subtraction starting at 100 (0/3) 3 3 3 3 3   Language: Repeat phrase (0/2) 2 2 2 2 1   Language : Fluency (0/1) 1 1 1 1 1   Abstraction (0/2) 2 2 2 2 2   Delayed Recall (0/5) 3 3 3 3 3   Orientation (0/6) 6 6 6 6 6   Total 28 27 28 27 27   Adjusted Score (based on education) 28   27 27    DIAGNOSTIC DATA (LABS, IMAGING, TESTING) - I reviewed patient records, labs, notes, testing and imaging myself where available.  Lab  Results  Component Value Date   WBC 6.5 05/12/2016   HGB 15.8 05/12/2016   HCT 46.0 05/12/2016   MCV 85.0 05/12/2016   PLT 291 05/12/2016      Component Value Date/Time   NA 139 05/12/2016 0947   K 3.9 05/12/2016  0947   CL 104 05/12/2016 0947   CO2 27 05/12/2016 0947   GLUCOSE 122 (H) 05/12/2016 0947   BUN 15 05/12/2016 0947   CREATININE 0.93 05/12/2016 0947   CALCIUM  9.3 05/12/2016 0947   ALT 37 10/11/2021 0742   GFRNONAA >60 05/12/2016 0947   GFRAA >60 05/12/2016 0947   Lab Results  Component Value Date   CHOL 138 10/11/2021   HDL 55 10/11/2021   LDLCALC 61 10/11/2021   TRIG 122 10/11/2021   CHOLHDL 2.5 10/11/2021   No results found for: HGBA1C No results found for: VITAMINB12 No results found for: TSH  HgA1C 6.1 TSH 0.92  ATN Profile: AB42/40 0.094 (low). Plasma NfL high, p Tau normal   ApoE4 Genotype: E3/E4   MRI Brain 09/24/2022 Brain volume was normal for age Some scattered T2/FLAIR hyperintense foci in the cerebral hemispheres with small confluencies in the periatrial white matter.  This is most consistent with mild chronic microvascular ischemic change, fairly typical for age and stable compared to the 06/09/2021 MRI. Susceptibility weighted images did not show any hemosiderin deposition.   No acute findings.   MRI Brain 04/24/2023 Stable MRI brain without contrast. No evidence of ARIA-E or ARIA-H.   MRI of the brain 05/22/23 Stable brain without contrast.  No evidence of ARIA-E or ARIA-H.  MRI of the brain 08/08/2023 Stable brain without contrast.  No evidence of ARIA-E or ARIA-H.  MRI of the brain 09/09/2023 Stable brain without contrast.  No evidence of ARIA-E or ARIA-H.  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  ASSESSMENT AND PLAN  74 y.o. year old male with history of hypertension, hyperlipidemia, CAD, emphysema. 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  He remains on Leqembi .  Initial infusion 01/10/23, he has been receiving bimonthly infusions every 2 weeks. MRI of the brain was last done in June 2025 without evidence of ARIA-E  or ARIA-H. His MOCA is stable 28/30. Functional Activities Questionnaire was 0. He will be nearing his 18 month infusion mark around April/May 2026. He has remained very stable, without issue while on Leqembi .   Lauraine Gayland MANDES, DNP  [facility] [address], [address] [place], [place] [address] [phone]  "

## 2024-04-29 ENCOUNTER — Telehealth: Payer: Self-pay | Admitting: *Deleted

## 2024-04-29 NOTE — Telephone Encounter (Addendum)
 Per Mliss with [facility] relayed his office note states a different  diagnosis than his order, and Checklist.  They will not allow us  to submit unless the diagnosis match.     She said that all order, checklist, faq have to have the same diagnoses.

## 2024-04-29 NOTE — Telephone Encounter (Signed)
" °  Order given to Bri in [facility]  "

## 2024-04-29 NOTE — Telephone Encounter (Signed)
 Evan [name]

## 2024-04-30 NOTE — Telephone Encounter (Signed)
 From [facility],  she said :   It was Alzheimers Disease unspecified.  We can take it off of the order to match the checklist, and office notes if she wants us  to.

## 2024-04-30 NOTE — Telephone Encounter (Signed)
 After speaking with Bri and Mliss with [employer],  The checklist needs to be G30.0 F02.AO, the other F02.80 crossed off.   Bri to change on order / new and bring to you to sign.

## 2024-06-17 ENCOUNTER — Ambulatory Visit: Admitting: Neurology

## 2024-10-21 ENCOUNTER — Ambulatory Visit: Admitting: Neurology
